# Patient Record
Sex: Female | Born: 1995 | Race: White | Hispanic: No | Marital: Single | Smoking: Never smoker
Health system: Southern US, Community
[De-identification: ages and names within clinical notes are randomized; demographics above are authoritative.]

## PROBLEM LIST (undated history)

## (undated) DIAGNOSIS — F32A Depression, unspecified: Secondary | ICD-10-CM

## (undated) DIAGNOSIS — F329 Major depressive disorder, single episode, unspecified: Secondary | ICD-10-CM

## (undated) DIAGNOSIS — J45909 Unspecified asthma, uncomplicated: Secondary | ICD-10-CM

## (undated) DIAGNOSIS — T7840XA Allergy, unspecified, initial encounter: Secondary | ICD-10-CM

## (undated) HISTORY — DX: Allergy, unspecified, initial encounter: T78.40XA

## (undated) HISTORY — DX: Depression, unspecified: F32.A

## (undated) HISTORY — DX: Unspecified asthma, uncomplicated: J45.909

---
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
## 2015-02-03 ENCOUNTER — Other Ambulatory Visit: Payer: Self-pay | Admitting: Physician Assistant

## 2015-02-09 IMAGING — CR DG CHEST 2V
2 series · 2 of 2 positions shown · non-contrast
Comparison: None.

CLINICAL DATA: Dyspnea, asthma

EXAM:
CHEST  2 VIEW

[w chest pa]
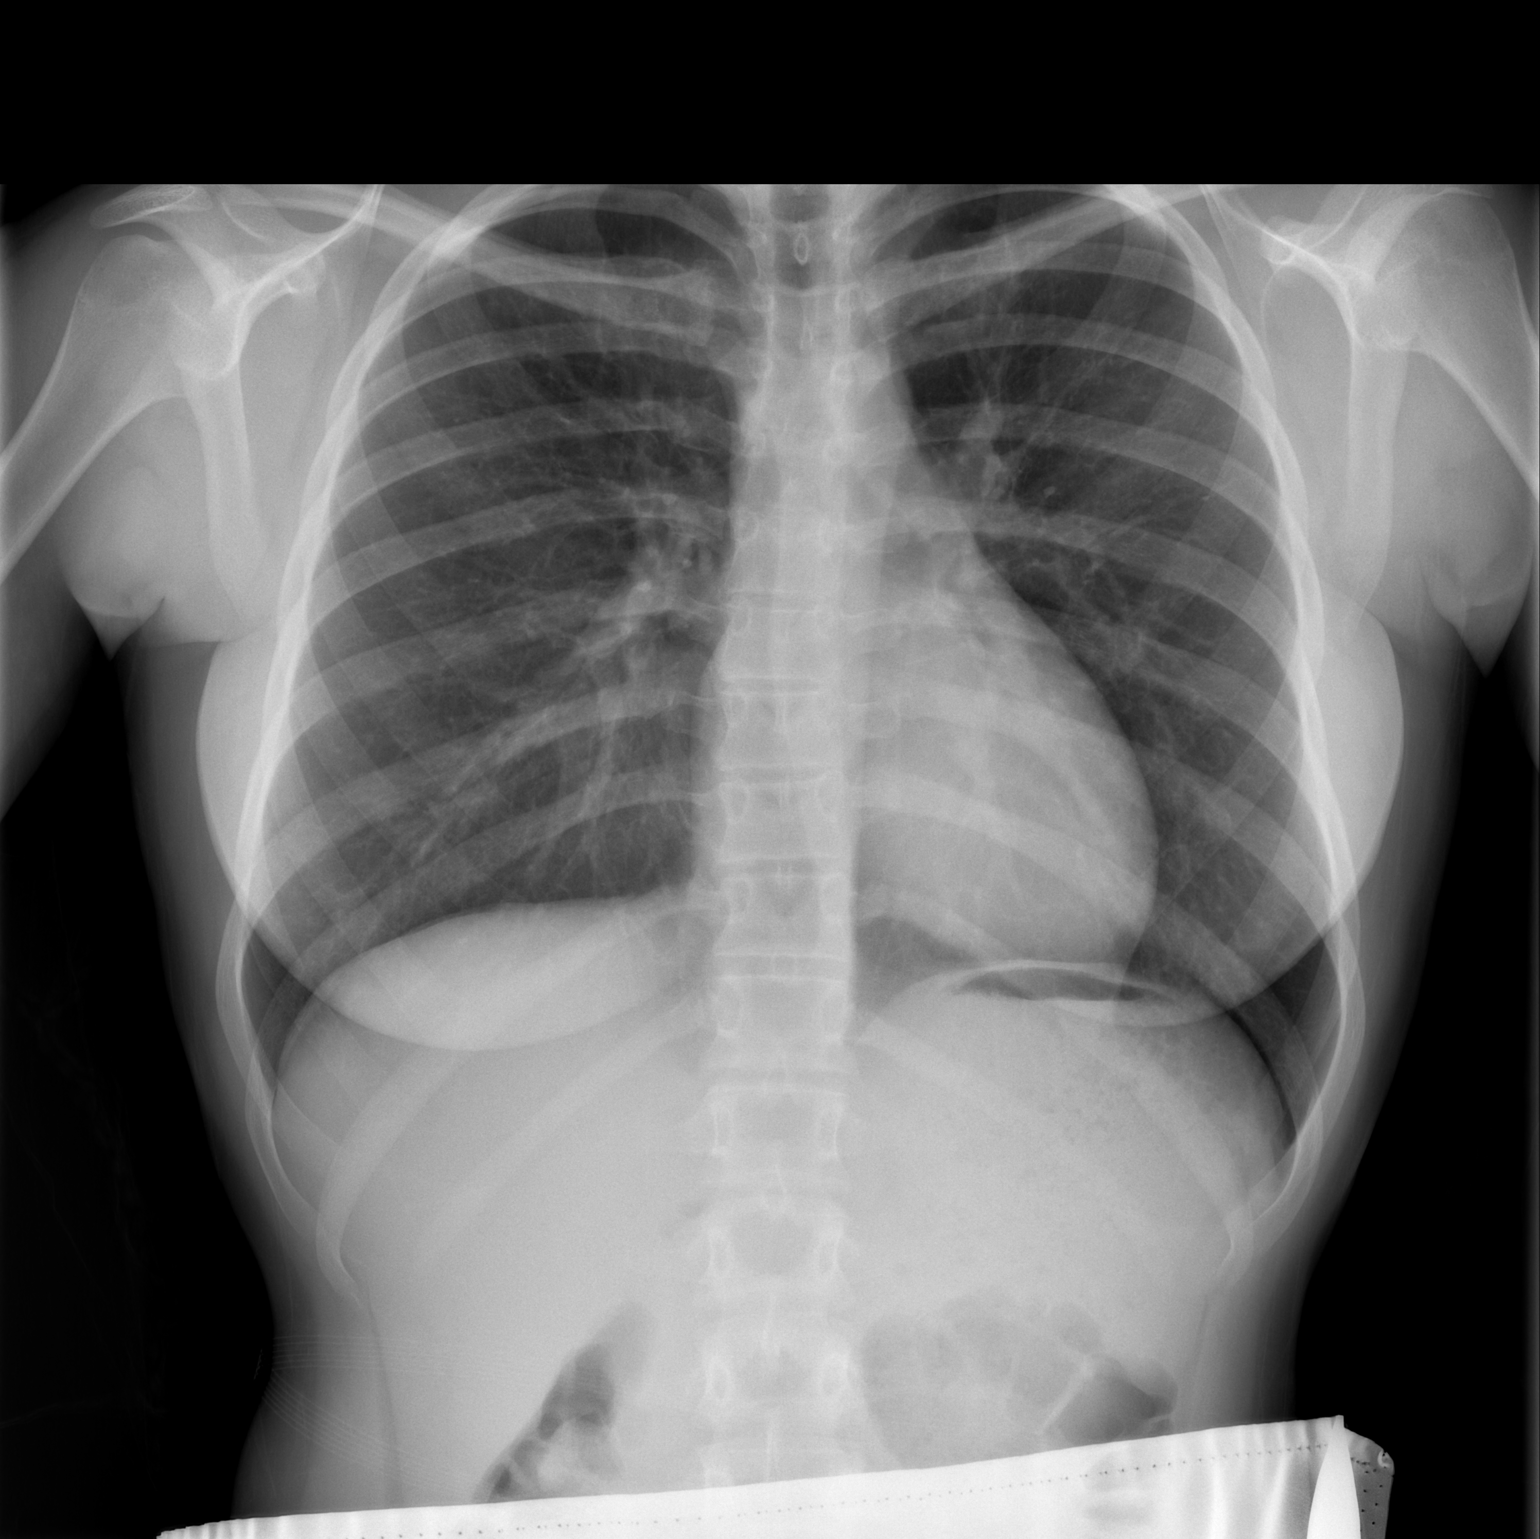

[w chest lat]
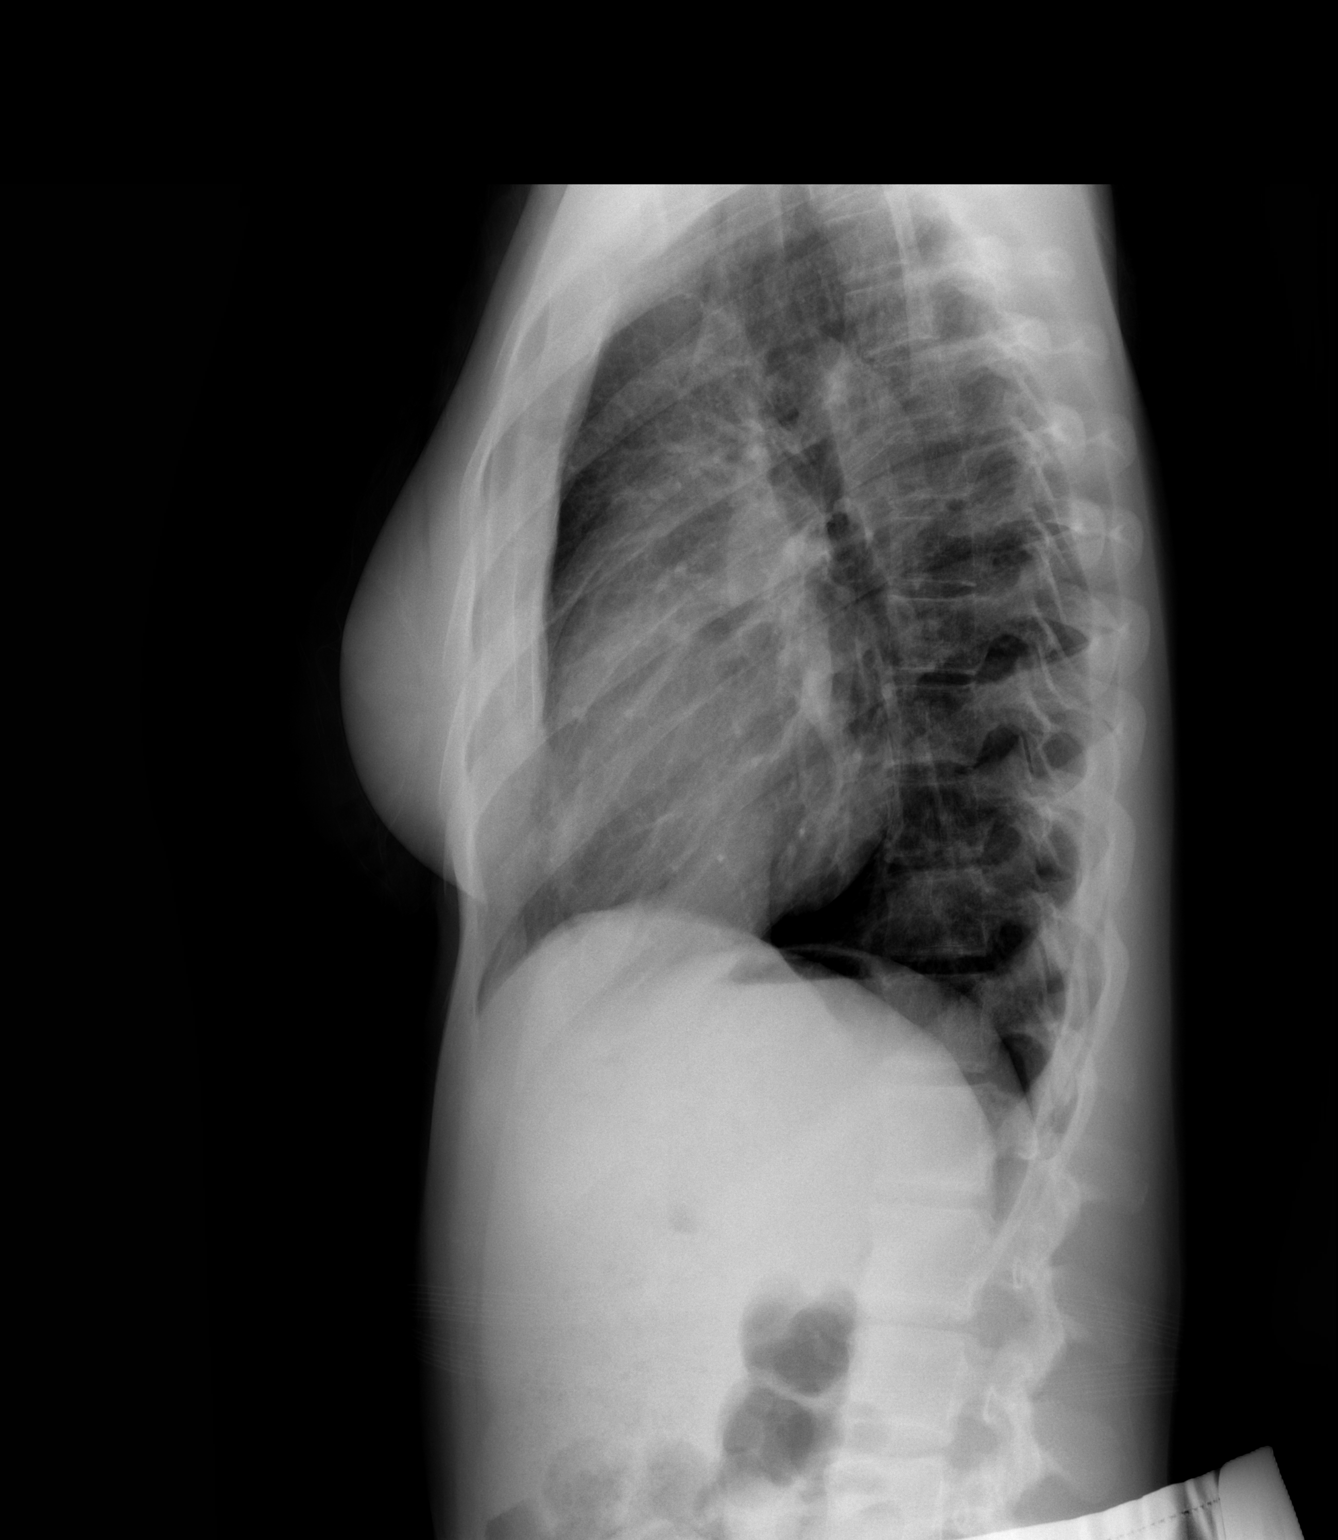

[2 of 2 positions shown; findings below may reference images not displayed]

FINDINGS: Lungs are clear. No pleural effusion or pneumothorax.

The heart is normal size.

Visualized osseous structures are within normal limits.
IMPRESSION: Normal chest radiographs.

## 2016-05-11 ENCOUNTER — Encounter: Payer: Self-pay | Admitting: Physician Assistant

## 2016-05-11 ENCOUNTER — Ambulatory Visit (INDEPENDENT_AMBULATORY_CARE_PROVIDER_SITE_OTHER): Payer: BC Managed Care – PPO | Admitting: Physician Assistant

## 2016-05-11 VITALS — BP 103/58 | HR 58 | Temp 97.6°F | Ht 64.0 in | Wt 145.6 lb

## 2016-05-11 DIAGNOSIS — F902 Attention-deficit hyperactivity disorder, combined type: Secondary | ICD-10-CM | POA: Diagnosis not present

## 2016-05-11 DIAGNOSIS — N946 Dysmenorrhea, unspecified: Secondary | ICD-10-CM

## 2016-05-11 DIAGNOSIS — J301 Allergic rhinitis due to pollen: Secondary | ICD-10-CM

## 2016-05-11 DIAGNOSIS — Z8709 Personal history of other diseases of the respiratory system: Secondary | ICD-10-CM | POA: Insufficient documentation

## 2016-05-11 DIAGNOSIS — Z23 Encounter for immunization: Secondary | ICD-10-CM | POA: Diagnosis not present

## 2016-05-11 MED ORDER — METHYLPHENIDATE HCL ER (OSM) 54 MG PO TBCR: 54.0000 mg | EXTENDED_RELEASE_TABLET | ORAL | 0 refills | Status: DC

## 2016-05-11 MED ORDER — LEVONORGESTREL-ETHINYL ESTRAD 90-20 MCG PO TABS: 1.0000 | ORAL_TABLET | Freq: Every day | ORAL | 99 refills | Status: DC

## 2016-05-11 MED ORDER — CETIRIZINE HCL 10 MG PO TABS: 10.0000 mg | ORAL_TABLET | Freq: Every day | ORAL | 11 refills | Status: DC

## 2016-05-11 NOTE — Progress Notes (Signed)
.   BP (!) 103/58   Pulse (!) 58   Temp 97.6 F (36.4 C) (Oral)   Ht 5\' 4"  (1.626 m)   Wt 145 lb 9.6 oz (66 kg)   BMI 24.99 kg/m    Subjective:    Patient ID: Natalie Hawkins, female    DOB: May 03, 1996, 20 y.o.   MRN: [id number]  Natalie Hawkins is a 20 y.o. female presenting on 05/11/2016 for Follow-up  HPI Patient here to be established as new patient at [facility].  This patient is known to me from [facility]. Patient has been known to me for many years. 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 Her hopes are this next year to get an audition for a cruise line. She is a very intelligent young lady. 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 But she is working on dancing still until she plans to come back and go to college when her dancing years are closing. 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 She is not sexually active and has never been sexually active. Her cycles are well controlled with her birth control pills. She is doing well with her health overall. She would like a flu vaccine today. She has not taken her ADHD medicine in about 6 months and she would like to restart that medication. She took it throughout her middle and high school years. We reviewed her record and her last prescriptions were in mid 2016. She has been on Concerta 54 mg in the past.   Relevant past medical, surgical, family and social history reviewed and updated as indicated. Interim medical history since our last visit reviewed. Allergies and medications reviewed and updated.   Data reviewed from any sources in EPIC.  Review of Systems  Constitutional: Negative.  Negative for activity change, fatigue and fever.  HENT: Negative.   Eyes: Negative.   Respiratory: Negative.  Negative for cough.   Cardiovascular: Negative.   Negative for chest pain.  Gastrointestinal: Negative.  Negative for abdominal pain.  Endocrine: Negative.   Genitourinary: Negative.  Negative for dysuria.  Musculoskeletal: Negative.   Skin: Negative.   Neurological: Negative.     Per HPI unless specifically indicated above  Social History   Social History  . Marital status: Single    Spouse name: N/A  . Number of children: N/A  . Years of education: N/A   Occupational History  . Not on file.   Social History Main Topics  . Smoking status: Never Smoker  . Smokeless tobacco: Never Used  . Alcohol use No  . Drug use: No  . Sexual activity: No   Other Topics Concern  . Not on file   Social History Narrative  . No narrative on file    History reviewed. No pertinent surgical history.  History reviewed. No pertinent family history.    Medication List       Accurate as of 05/11/16  9:03 AM. Always use your most recent med list.          cetirizine 10 MG tablet Commonly known as:  ZYRTEC Take 1 tablet (10 mg total) by mouth daily.   levonorgestrel-ethinyl estradiol 90-20 MCG tablet Commonly known as:  LYBREL,AMETHYST Take 1 tablet by mouth daily.   methylphenidate 54  MG CR tablet Commonly known as:  CONCERTA Take 1 tablet (54 mg total) by mouth every morning.   methylphenidate 54 MG CR tablet Commonly known as:  CONCERTA Take 1 tablet (54 mg total) by mouth every morning.   methylphenidate 54 MG CR tablet Commonly known as:  CONCERTA Take 1 tablet (54 mg total) by mouth every morning.          Objective:    BP (!) 103/58   Pulse (!) 58   Temp 97.6 F (36.4 C) (Oral)   Ht 5\' 4"  (1.626 m)   Wt 145 lb 9.6 oz (66 kg)   BMI 24.99 kg/m   No Known Allergies Wt Readings from Last 3 Encounters:  05/11/16 145 lb 9.6 oz (66 kg) (76 %, Z= 0.69)*   * Growth percentiles are based on CDC 2-20 Years data.    Physical Exam  Constitutional: She is oriented to person, place, and time. She appears  well-developed and well-nourished.  HENT:  Head: Normocephalic and atraumatic.  Eyes: Conjunctivae and EOM are normal. Pupils are equal, round, and reactive to light.  Neck: Normal range of motion. Neck supple.  Cardiovascular: Normal rate, regular rhythm, normal heart sounds and intact distal pulses.   Pulmonary/Chest: Effort normal and breath sounds normal.  Abdominal: Soft. Bowel sounds are normal.  Neurological: She is alert and oriented to person, place, and time. She has normal reflexes.  Skin: Skin is warm and dry. No rash noted.  Psychiatric: She has a normal mood and affect. Her behavior is normal. Judgment and thought content normal.        Assessment & Plan:   1. Acute seasonal allergic rhinitis due to pollen - cetirizine (ZYRTEC) 10 MG tablet; Take 1 tablet (10 mg total) by mouth daily.  Dispense: 30 tablet; Refill: 11  2. Attention deficit hyperactivity disorder (ADHD), combined type - methylphenidate 54 MG PO CR tablet; Take 1 tablet (54 mg total) by mouth every morning.  Dispense: 30 tablet; Refill: 0 - methylphenidate (CONCERTA) 54 MG PO CR tablet; Take 1 tablet (54 mg total) by mouth every morning.  Dispense: 30 tablet; Refill: 0 - methylphenidate (CONCERTA) 54 MG PO CR tablet; Take 1 tablet (54 mg total) by mouth every morning.  Dispense: 30 tablet; Refill: 0  3. Dysmenorrhea - levonorgestrel-ethinyl estradiol (LYBREL,AMETHYST) 90-20 MCG tablet; Take 1 tablet by mouth daily.  Dispense: 1 Package; Refill: prn  4. History of asthma Report any changes and we can restart medications if she needs them.   Continue all other maintenance medications as listed above. Educational handout given for ADD.  Follow up plan: Return in about 3 months (around 08/11/2016).  Remus LofflerAngel S. Orissa Arreaga PA-C [facility] [address]  [place], [place] [id number] [phone]   05/11/2016, 9:03 AM

## 2016-05-11 NOTE — Patient Instructions (Addendum)
Attention Deficit Hyperactivity Disorder  Attention deficit hyperactivity disorder (ADHD) is a problem with behavior issues based on the way the brain functions (neurobehavioral disorder). It is a common reason for behavior and academic problems in school.  SYMPTOMS   There are 3 types of ADHD. The 3 types and some of the symptoms include:  · Inattentive.    Gets bored or distracted easily.    Loses or forgets things. Forgets to hand in homework.    Has trouble organizing or completing tasks.    Difficulty staying on task.    An inability to organize daily tasks and school work.    Leaving projects, chores, or homework unfinished.    Trouble paying attention or responding to details. Careless mistakes.    Difficulty following directions. Often seems like is not listening.    Dislikes activities that require sustained attention (like chores or homework).  · Hyperactive-impulsive.    Feels like it is impossible to sit still or stay in a seat. Fidgeting with hands and feet.    Trouble waiting turn.    Talking too much or out of turn. Interruptive.    Speaks or acts impulsively.    Aggressive, disruptive behavior.    Constantly busy or on the go; noisy.    Often leaves seat when they are expected to remain seated.    Often runs or climbs where it is not appropriate, or feels very restless.  · Combined.    Has symptoms of both of the above.  Often children with ADHD feel discouraged about themselves and with school. They often perform well below their abilities in school.  As children get older, the excess motor activities can calm down, but the problems with paying attention and staying organized persist. Most children do not outgrow ADHD but with good treatment can learn to cope with the symptoms.  DIAGNOSIS   When ADHD is suspected, the diagnosis should be made by professionals trained in ADHD. This professional will collect information about the individual suspected of having ADHD. Information must be collected from  various settings where the person lives, works, or attends school.    Diagnosis will include:  · Confirming symptoms began in childhood.  · Ruling out other reasons for the child's behavior.  · The health care providers will check with the child's school and check their medical records.  · They will talk to teachers and parents.  · Behavior rating scales for the child will be filled out by those dealing with the child on a daily basis.  A diagnosis is made only after all information has been considered.  TREATMENT   Treatment usually includes behavioral treatment, tutoring or extra support in school, and stimulant medicines. Because of the way a person's brain works with ADHD, these medicines decrease impulsivity and hyperactivity and increase attention. This is different than how they would work in a person who does not have ADHD. Other medicines used include antidepressants and certain blood pressure medicines.  Most experts agree that treatment for ADHD should address all aspects of the person's functioning. Along with medicines, treatment should include structured classroom management at school. Parents should reward good behavior, provide constant discipline, and set limits. Tutoring should be available for the child as needed.  ADHD is a lifelong condition. If untreated, the disorder can have long-term serious effects into adolescence and adulthood.  HOME CARE INSTRUCTIONS   · Often with ADHD there is a lot of frustration among family members dealing with the condition. Blame   and anger are also feelings that are common. In many cases, because the problem affects the family as a whole, the entire family may need help. A therapist can help the family find better ways to handle the disruptive behaviors of the person with ADHD and promote change. If the person with ADHD is young, most of the therapist's work is with the parents. Parents will learn techniques for coping with and improving their child's behavior.  Sometimes only the child with the ADHD needs counseling. Your health care providers can help you make these decisions.  · Children with ADHD may need help learning how to organize. Some helpful tips include:  ¨ Keep routines the same every day from wake-up time to bedtime. Schedule all activities, including homework and playtime. Keep the schedule in a place where the person with ADHD will often see it. Mark schedule changes as far in advance as possible.  ¨ Schedule outdoor and indoor recreation.  ¨ Have a place for everything and keep everything in its place. This includes clothing, backpacks, and school supplies.  ¨ Encourage writing down assignments and bringing home needed books. Work with your child's teachers for assistance in organizing school work.  · Offer your child a well-balanced diet. Breakfast that includes a balance of whole grains, protein, and fruits or vegetables is especially important for school performance. Children should avoid drinks with caffeine including:  ¨ Soft drinks.  ¨ Coffee.  ¨ Tea.  ¨ However, some older children (adolescents) may find these drinks helpful in improving their attention. Because it can also be common for adolescents with ADHD to become addicted to caffeine, talk with your health care provider about what is a safe amount of caffeine intake for your child.  · Children with ADHD need consistent rules that they can understand and follow. If rules are followed, give small rewards. Children with ADHD often receive, and expect, criticism. Look for good behavior and praise it. Set realistic goals. Give clear instructions. Look for activities that can foster success and self-esteem. Make time for pleasant activities with your child. Give lots of affection.  · Parents are their children's greatest advocates. Learn as much as possible about ADHD. This helps you become a stronger and better advocate for your child. It also helps you educate your child's teachers and instructors  if they feel inadequate in these areas. Parent support groups are often helpful. A national group with local chapters is called Children and Adults with Attention Deficit Hyperactivity Disorder (CHADD).  SEEK MEDICAL CARE IF:  · Your child has repeated muscle twitches, cough, or speech outbursts.  · Your child has sleep problems.  · Your child has a marked loss of appetite.  · Your child develops depression.  · Your child has new or worsening behavioral problems.  · Your child develops dizziness.  · Your child has a racing heart.  · Your child has stomach pains.  · Your child develops headaches.  SEEK IMMEDIATE MEDICAL CARE IF:  · Your child has been diagnosed with depression or anxiety and the symptoms seem to be getting worse.  · Your child has been depressed and suddenly appears to have increased energy or motivation.  · You are worried that your child is having a bad reaction to a medication he or she is taking for ADHD.     This information is not intended to replace advice given to you by your health care provider. Make sure you discuss any questions you have with your   health care provider.     Document Released: 07/15/2002 Document Revised: 07/30/2013 Document Reviewed: 04/01/2013  Elsevier Interactive Patient Education ©2016 Elsevier Inc.

## 2016-07-28 ENCOUNTER — Ambulatory Visit (INDEPENDENT_AMBULATORY_CARE_PROVIDER_SITE_OTHER): Payer: BC Managed Care – PPO | Admitting: Family Medicine

## 2016-07-28 ENCOUNTER — Encounter: Payer: Self-pay | Admitting: Family Medicine

## 2016-07-28 VITALS — BP 121/76 | HR 95 | Temp 97.6°F | Ht 64.0 in | Wt 133.2 lb

## 2016-07-28 DIAGNOSIS — A084 Viral intestinal infection, unspecified: Secondary | ICD-10-CM

## 2016-07-28 MED ORDER — ONDANSETRON 4 MG PO TBDP: 4.0000 mg | ORAL_TABLET | Freq: Three times a day (TID) | ORAL | 0 refills | Status: DC | PRN

## 2016-07-28 NOTE — Patient Instructions (Signed)
Great to meet you!   Viral Gastroenteritis, Adult Introduction Viral gastroenteritis is also known as the stomach flu. This condition is caused by certain germs (viruses). These germs can be passed from person to person very easily (are very contagious). This condition can cause sudden watery poop (diarrhea), fever, and throwing up (vomiting). Having watery poop and throwing up can make you feel weak and cause you to get dehydrated. Dehydration can make you tired and thirsty, make you have a dry mouth, and make it so you pee (urinate) less often. Older adults and people with other diseases or a weak defense system (immune system) are at higher risk for dehydration. It is important to replace the fluids that you lose from having watery poop and throwing up. Follow these instructions at home: Follow instructions from your doctor about how to care for yourself at home. Eating and drinking Follow these instructions as told by your doctor:  Take an oral rehydration solution (ORS). This is a drink that is sold at pharmacies and stores.  Drink clear fluids in small amounts as you are able, such as:  Water.  Ice chips.  Diluted fruit juice.  Low-calorie sports drinks.  Eat bland, easy-to-digest foods in small amounts as you are able, such as:  Bananas.  Applesauce.  Rice.  Low-fat (lean) meats.  Toast.  Crackers.  Avoid fluids that have a lot of sugar or caffeine in them.  Avoid alcohol.  Avoid spicy or fatty foods. General instructions  Drink enough fluid to keep your pee (urine) clear or pale yellow.  Wash your hands often. If you cannot use soap and water, use hand sanitizer.  Make sure that all people in your home wash their hands well and often.  Rest at home while you get better.  Take over-the-counter and prescription medicines only as told by your doctor.  Watch your condition for any changes.  Take a warm bath to help with any burning or pain from having  watery poop.  Keep all follow-up visits as told by your doctor. This is important. Contact a doctor if:  You cannot keep fluids down.  Your symptoms get worse.  You have new symptoms.  You feel light-headed or dizzy.  You have muscle cramps. Get help right away if:  You have chest pain.  You feel very weak or you pass out (faint).  You see blood in your throw-up.  Your throw-up looks like coffee grounds.  You have bloody or black poop (stools) or poop that look like tar.  You have a very bad headache, a stiff neck, or both.  You have a rash.  You have very bad pain, cramping, or bloating in your belly (abdomen).  You have trouble breathing.  You are breathing very quickly.  Your heart is beating very quickly.  Your skin feels cold and clammy.  You feel confused.  You have pain when you pee.  You have signs of dehydration, such as:  Dark pee, hardly any pee, or no pee.  Cracked lips.  Dry mouth.  Sunken eyes.  Sleepiness.  Weakness. This information is not intended to replace advice given to you by your health care provider. Make sure you discuss any questions you have with your health care provider. Document Released: 01/11/2008 Document Revised: 02/12/2016 Document Reviewed: 03/31/2015  2017 Elsevier

## 2016-07-28 NOTE — Progress Notes (Signed)
   HPI  Patient presents today here with nausea and abdominal pain.  Patient explains that she's had symptoms for about 2 days. They started after she drank a protein shake and had a vigorous exercise workout. She has had some similar illness with a protein shake previously that lasted 15 minutes and r resolves.  She states however this time it did not improve. She's also had fatigue, generalized abdominal pain, and nausea during this time. She is generally a little bit constipated that has not changed. She is tolerating food and fluids normally but has a decreased appetite.  No fevers, chills, sweats, cough, chest pain, severe abdominal pain, or fluid intolerance.  PMH: Smoking status noted ROS: Per HPI  Objective: BP 121/76   Pulse 95   Temp 97.6 F (36.4 C) (Oral)   Ht 5\' 4"  (1.626 m)   Wt 133 lb 3.2 oz (60.4 kg)   BMI 22.86 kg/m  Gen: NAD, alert, cooperative with exam HEENT: NCAT, TMs normal bilaterally, oropharynx moist and clear with slightly enlarged tonsils bilaterally, nares clear CV: RRR, good S1/S2, no murmur Resp: CTABL, no wheezes, non-labored Abd: Soft, mild to palpation in the right lower quadrant and less severe throughout, positive bowel sounds Ext: No edema, warm Neuro: Alert and oriented, No gross deficits  Assessment and plan:  # Viral gastroenteritis Reassurance provided Low threshold for return with mild right lower quadrant tenderness, however I think acute appendicitis is very unlikely Small amount of Zofran given Discussed supportive care, return as needed   Meds ordered this encounter  Medications  . ondansetron (ZOFRAN-ODT) 4 MG disintegrating tablet    Sig: Take 1 tablet (4 mg total) by mouth every 8 (eight) hours as needed for nausea or vomiting.    Dispense:  20 tablet    Refill:  0    Murtis SinkSam Asuncion Shibata, MD [facility] 07/28/2016, 9:08 AM

## 2016-08-03 ENCOUNTER — Telehealth: Payer: Self-pay | Admitting: *Deleted

## 2016-08-03 NOTE — Telephone Encounter (Signed)
Patient's mother French Ana(Tracy) called stating that patient will be running out of her medication soon.  Patient is moving to [place] on 08/10/2016.  Patient is needing a refill on Concerta before she moves.

## 2016-08-03 NOTE — Telephone Encounter (Signed)
Can she make the 2:40 spot tomorrow?

## 2016-08-03 NOTE — Telephone Encounter (Signed)
Appt made

## 2016-08-04 ENCOUNTER — Ambulatory Visit (INDEPENDENT_AMBULATORY_CARE_PROVIDER_SITE_OTHER): Payer: BC Managed Care – PPO | Admitting: Physician Assistant

## 2016-08-04 ENCOUNTER — Encounter: Payer: Self-pay | Admitting: Physician Assistant

## 2016-08-04 VITALS — BP 131/87 | HR 83 | Temp 97.0°F | Ht 64.0 in | Wt 134.0 lb

## 2016-08-04 DIAGNOSIS — L700 Acne vulgaris: Secondary | ICD-10-CM

## 2016-08-04 DIAGNOSIS — F902 Attention-deficit hyperactivity disorder, combined type: Secondary | ICD-10-CM

## 2016-08-04 MED ORDER — METHYLPHENIDATE HCL ER (OSM) 54 MG PO TBCR: 54.0000 mg | EXTENDED_RELEASE_TABLET | ORAL | 0 refills | Status: DC

## 2016-08-04 NOTE — Patient Instructions (Signed)

## 2016-08-09 NOTE — Progress Notes (Signed)
BP 131/87   Pulse 83   Temp 97 F (36.1 C) (Oral)   Ht 5\' 4"  (1.626 m)   Wt 134 lb (60.8 kg)   LMP 07/28/2016 (Approximate)   BMI 23.00 kg/m    Subjective:    Patient ID: Natalie Hawkins, female    DOB: 27-Sep-1995, 21 y.o.   MRN: [id number]  HPI: Natalie Hawkins is a 21 y.o. female presenting on 08/04/2016 for ADHD (pt here today for refills on her Concerta and she will be out of state for the next 3 months in [place])  This patient comes in for periodic recheck on medications and conditions. All medications are reviewed today. There are no reports of any problems with the medications. All of the medical conditions are reviewed and updated.  Lab work is reviewed and will be ordered as medically necessary. There are no new problems reported with today's visit. 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 She may be unavailable 3 months to come back for a recheck. She has been a very good in complaint patient over the years. If everything is well controlled we will discuss refilling her medications with the help of her mother.  Past Medical History:  Diagnosis Date  . Allergy   . Asthma    Well-controlled at this time   Relevant past medical, surgical, family and social history reviewed and updated as indicated. Interim medical history since our last visit reviewed. Allergies and medications reviewed and updated. DATA REVIEWED: CHART IN EPIC  Social History   Social History  . Marital status: Single    Spouse name: N/A  . Number of children: N/A  . Years of education: N/A   Occupational History  . Not on file.   Social History Main Topics  . Smoking status: Never Smoker  . Smokeless tobacco: Never Used  . Alcohol use No  . Drug use: No  . Sexual activity: No   Other Topics Concern  . Not on file   Social History Narrative  . No narrative on file    No past surgical history on file.  Family History  Problem Relation Age of  Onset  . Hypothyroidism Father   . ADD / ADHD Sister   . ADD / ADHD Brother   . Arthritis Maternal Grandmother   . Depression Maternal Grandmother   . Heart disease Maternal Grandmother     Review of Systems  Constitutional: Negative.  Negative for activity change, fatigue and fever.  HENT: Negative.   Eyes: Negative.   Respiratory: Negative.  Negative for cough.   Cardiovascular: Negative.  Negative for chest pain.  Gastrointestinal: Negative.  Negative for abdominal pain.  Endocrine: Negative.   Genitourinary: Negative.  Negative for dysuria.  Musculoskeletal: Negative.   Skin: Negative.   Neurological: Negative.     Allergies as of 08/04/2016   No Known Allergies     Medication List       Accurate as of 08/04/16 11:59 PM. Always use your most recent med list.          cetirizine 10 MG tablet Commonly known as:  ZYRTEC Take 1 tablet (10 mg total) by mouth daily.   levonorgestrel-ethinyl estradiol 90-20 MCG tablet Commonly known as:  LYBREL,AMETHYST Take 1 tablet by mouth daily.   methylphenidate 54 MG CR tablet Commonly known as:  CONCERTA Take 1 tablet (54 mg total) by mouth every morning.   methylphenidate 54 MG CR tablet  Commonly known as:  CONCERTA Take 1 tablet (54 mg total) by mouth every morning.   methylphenidate 54 MG CR tablet Commonly known as:  CONCERTA Take 1 tablet (54 mg total) by mouth every morning.   metroNIDAZOLE 500 MG tablet Commonly known as:  FLAGYL Take 250 mg by mouth 1 day or 1 dose.          Objective:    BP 131/87   Pulse 83   Temp 97 F (36.1 C) (Oral)   Ht 5\' 4"  (1.626 m)   Wt 134 lb (60.8 kg)   LMP 07/28/2016 (Approximate)   BMI 23.00 kg/m   No Known Allergies  Wt Readings from Last 3 Encounters:  08/04/16 134 lb (60.8 kg)  07/28/16 133 lb 3.2 oz (60.4 kg)  05/11/16 145 lb 9.6 oz (66 kg) (76 %, Z= 0.69)*   * Growth percentiles are based on CDC 2-20 Years data.    Physical Exam  Constitutional: She is  oriented to person, place, and time. She appears well-developed and well-nourished.  HENT:  Head: Normocephalic and atraumatic.  Right Ear: Tympanic membrane, external ear and ear canal normal.  Left Ear: Tympanic membrane, external ear and ear canal normal.  Nose: Nose normal. No rhinorrhea.  Mouth/Throat: Oropharynx is clear and moist and mucous membranes are normal. No oropharyngeal exudate or posterior oropharyngeal erythema.  Eyes: Conjunctivae and EOM are normal. Pupils are equal, round, and reactive to light.  Neck: Normal range of motion. Neck supple.  Cardiovascular: Normal rate, regular rhythm, normal heart sounds and intact distal pulses.   Pulmonary/Chest: Effort normal and breath sounds normal.  Abdominal: Soft. Bowel sounds are normal.  Neurological: She is alert and oriented to person, place, and time. She has normal reflexes.  Skin: Skin is warm and dry. No rash noted.  Psychiatric: She has a normal mood and affect. Her behavior is normal. Judgment and thought content normal.  Nursing note and vitals reviewed.   No results found for this or any previous visit.    Assessment & Plan:   1. Attention deficit hyperactivity disorder (ADHD), combined type - methylphenidate 54 MG PO CR tablet; Take 1 tablet (54 mg total) by mouth every morning.  Dispense: 30 tablet; Refill: 0 - methylphenidate (CONCERTA) 54 MG PO CR tablet; Take 1 tablet (54 mg total) by mouth every morning.  Dispense: 30 tablet; Refill: 0 - methylphenidate (CONCERTA) 54 MG PO CR tablet; Take 1 tablet (54 mg total) by mouth every morning.  Dispense: 30 tablet; Refill: 0  2. Acne vulgaris - metroNIDAZOLE (FLAGYL) 500 MG tablet; Take 250 mg by mouth 1 day or 1 dose.   Continue all other maintenance medications as listed above.  Follow up plan: Return in about 3 months (around 11/02/2016) for recheck.  No orders of the defined types were placed in this encounter.   Educational handout given for  ADHD  Remus LofflerAngel S. Talasia Saulter PA-C [facility] [address]  [place], [place] [address] [phone]   08/09/2016, 2:17 PM\

## 2016-09-13 ENCOUNTER — Encounter: Payer: Self-pay | Admitting: Physician Assistant

## 2016-09-13 ENCOUNTER — Ambulatory Visit (INDEPENDENT_AMBULATORY_CARE_PROVIDER_SITE_OTHER): Payer: BC Managed Care – PPO | Admitting: Physician Assistant

## 2016-09-13 VITALS — BP 131/89 | HR 97 | Temp 100.6°F | Ht 64.0 in | Wt 136.8 lb

## 2016-09-13 DIAGNOSIS — R52 Pain, unspecified: Secondary | ICD-10-CM

## 2016-09-13 DIAGNOSIS — J101 Influenza due to other identified influenza virus with other respiratory manifestations: Secondary | ICD-10-CM

## 2016-09-13 LAB — VERITOR FLU A/B WAIVED
Influenza A: NEGATIVE
Influenza B: POSITIVE — AB

## 2016-09-13 MED ORDER — OSELTAMIVIR PHOSPHATE 75 MG PO CAPS: 75.0000 mg | ORAL_CAPSULE | Freq: Two times a day (BID) | ORAL | 0 refills | Status: DC

## 2016-09-13 NOTE — Progress Notes (Signed)
BP 131/89   Pulse 97   Temp (!) 100.6 F (38.1 C) (Oral)   Ht 5\' 4"  (1.626 m)   Wt 136 lb 12.8 oz (62.1 kg)   BMI 23.48 kg/m    Subjective:    Patient ID: Natalie Hawkins, female    DOB: [birth year], 20 y.o.   MRN: [id number]  HPI: Natalie Hawkins is a 21 y.o. female presenting on 09/13/2016 for Chills; Generalized Body Aches; and Headache  This patient has had less than 2 days severe fever, chills, myalgias.  Complains of sinus headache and postnasal drainage. There is copious drainage at times. Associated sore throat, decreased appetite and headache.  Has been exposed to influenza.    Relevant past medical, surgical, family and social history reviewed and updated as indicated. Allergies and medications reviewed and updated.  Past Medical History:  Diagnosis Date  . Allergy   . Asthma    Well-controlled at this time    History reviewed. No pertinent surgical history.  Review of Systems  Constitutional: Positive for appetite change, chills, fatigue and fever. Negative for activity change.  HENT: Positive for congestion, postnasal drip and sore throat.   Eyes: Negative.   Respiratory: Negative for cough and wheezing.   Cardiovascular: Negative.  Negative for chest pain, palpitations and leg swelling.  Gastrointestinal: Negative.   Genitourinary: Negative.   Musculoskeletal: Positive for myalgias.  Skin: Negative.   Neurological: Positive for headaches.    Allergies as of 09/13/2016   No Known Allergies     Medication List       Accurate as of 09/13/16 12:59 PM. Always use your most recent med list.          cetirizine 10 MG tablet Commonly known as:  ZYRTEC Take 1 tablet (10 mg total) by mouth daily.   levonorgestrel-ethinyl estradiol 90-20 MCG tablet Commonly known as:  LYBREL,AMETHYST Take 1 tablet by mouth daily.   methylphenidate 54 MG CR tablet Commonly known as:  CONCERTA Take 1 tablet (54 mg total) by mouth every morning.   methylphenidate 54 MG CR  tablet Commonly known as:  CONCERTA Take 1 tablet (54 mg total) by mouth every morning.   methylphenidate 54 MG CR tablet Commonly known as:  CONCERTA Take 1 tablet (54 mg total) by mouth every morning.   oseltamivir 75 MG capsule Commonly known as:  TAMIFLU Take 1 capsule (75 mg total) by mouth 2 (two) times daily.          Objective:    BP 131/89   Pulse 97   Temp (!) 100.6 F (38.1 C) (Oral)   Ht 5\' 4"  (1.626 m)   Wt 136 lb 12.8 oz (62.1 kg)   BMI 23.48 kg/m   No Known Allergies  Physical Exam  Constitutional: She is oriented to person, place, and time. She appears well-developed and well-nourished. No distress.  HENT:  Head: Normocephalic and atraumatic.  Right Ear: Tympanic membrane normal.  Left Ear: Tympanic membrane normal.  Nose: Mucosal edema and rhinorrhea present. Right sinus exhibits no frontal sinus tenderness. Left sinus exhibits no frontal sinus tenderness.  Mouth/Throat: Posterior oropharyngeal erythema present. No oropharyngeal exudate or tonsillar abscesses.  Eyes: Conjunctivae and EOM are normal. Pupils are equal, round, and reactive to light.  Neck: Normal range of motion.  Cardiovascular: Normal rate, regular rhythm, normal heart sounds, intact distal pulses and normal pulses.   Pulmonary/Chest: Effort normal and breath sounds normal. No respiratory distress.  Abdominal: Soft. Bowel sounds are  normal.  Neurological: She is alert and oriented to person, place, and time. She has normal reflexes.  Skin: Skin is warm and dry. No rash noted.  Psychiatric: She has a normal mood and affect. Her behavior is normal. Judgment and thought content normal.  Nursing note and vitals reviewed.   No results found for this or any previous visit.    Assessment & Plan:   1. Body aches - Veritor Flu A/B Waived - oseltamivir (TAMIFLU) 75 MG capsule; Take 1 capsule (75 mg total) by mouth 2 (two) times daily.  Dispense: 10 capsule; Refill: 0  2. Influenza B -  oseltamivir (TAMIFLU) 75 MG capsule; Take 1 capsule (75 mg total) by mouth 2 (two) times daily.  Dispense: 10 capsule; Refill: 0   Continue all other maintenance medications as listed above.  Follow up plan: Return if symptoms worsen or fail to improve.  Orders Placed This Encounter  Procedures  . Veritor Flu A/B [employer] Health SolutionsWaived    Educational handout given for influenza  Remus LofflerAngel S. Mishti Swanton PA-C [facility] [address]  [place], [place] [address] [phone]   09/13/2016, 12:59 PM

## 2016-09-13 NOTE — Patient Instructions (Signed)

## 2016-11-02 ENCOUNTER — Ambulatory Visit: Payer: BC Managed Care – PPO | Admitting: Physician Assistant

## 2016-11-29 ENCOUNTER — Other Ambulatory Visit: Payer: Self-pay | Admitting: Physician Assistant

## 2016-11-29 DIAGNOSIS — F902 Attention-deficit hyperactivity disorder, combined type: Secondary | ICD-10-CM

## 2016-12-01 ENCOUNTER — Telehealth: Payer: Self-pay | Admitting: Physician Assistant

## 2016-12-01 DIAGNOSIS — F902 Attention-deficit hyperactivity disorder, combined type: Secondary | ICD-10-CM

## 2016-12-02 MED ORDER — METHYLPHENIDATE HCL ER (OSM) 54 MG PO TBCR: 54.0000 mg | EXTENDED_RELEASE_TABLET | ORAL | 0 refills | Status: DC

## 2016-12-02 NOTE — Telephone Encounter (Signed)
We can make arrangements to fill her meds and see her at least twice yearly when she is in town.  Was a refill needed now?  I am uncertain of the rules for midlevel controlled scripts being filled in [place].  They cannot in .  They may want to call a pharmacy there first.

## 2016-12-02 NOTE — Telephone Encounter (Signed)
Left detailed message per dpr  

## 2016-12-02 NOTE — Telephone Encounter (Signed)
Dad aware and states that patient will not be able to come down two times a year due to her working during the week and it also being a 4 hour drive. Dad states that Tangela will have to find a doctors office closer. Wanting to know if you can fill the Concerta for at least three months until she is able to find a new PCP. Please advise and send back to the pools.

## 2017-07-03 ENCOUNTER — Telehealth: Payer: Self-pay | Admitting: Physician Assistant

## 2017-07-03 ENCOUNTER — Other Ambulatory Visit: Payer: Self-pay | Admitting: Physician Assistant

## 2017-07-03 ENCOUNTER — Ambulatory Visit: Payer: BC Managed Care – PPO | Admitting: Pediatrics

## 2017-07-03 MED ORDER — ALBUTEROL SULFATE HFA 108 (90 BASE) MCG/ACT IN AERS: 2.0000 | INHALATION_SPRAY | Freq: Four times a day (QID) | RESPIRATORY_TRACT | 5 refills | Status: DC | PRN

## 2017-07-03 NOTE — Telephone Encounter (Signed)
What is the name of the medication? Inhaler, dad not sure of name of it  Have you contacted your pharmacy to request a refill? No he does not know name of inhaler, pt is in [place] and he will mail to her  Which pharmacy would you like this sent to? [facility] store [place]   Patient notified that their request is being sent to the clinical staff for review and that they should receive a call once it is complete. If they do not receive a call within 24 hours they can check with their pharmacy or our office.

## 2017-08-20 ENCOUNTER — Emergency Department (HOSPITAL_BASED_OUTPATIENT_CLINIC_OR_DEPARTMENT_OTHER)
Admission: EM | Admit: 2017-08-20 | Discharge: 2017-08-20 | Disposition: A | Discharge status: Home / Self Care | Payer: BC Managed Care – PPO | Attending: Emergency Medicine | Admitting: Emergency Medicine

## 2017-08-20 ENCOUNTER — Other Ambulatory Visit: Payer: Self-pay

## 2017-08-20 ENCOUNTER — Emergency Department (HOSPITAL_BASED_OUTPATIENT_CLINIC_OR_DEPARTMENT_OTHER): Payer: BC Managed Care – PPO

## 2017-08-20 ENCOUNTER — Encounter (HOSPITAL_BASED_OUTPATIENT_CLINIC_OR_DEPARTMENT_OTHER): Payer: Self-pay | Admitting: *Deleted

## 2017-08-20 DIAGNOSIS — Y999 Unspecified external cause status: Secondary | ICD-10-CM | POA: Diagnosis not present

## 2017-08-20 DIAGNOSIS — J45909 Unspecified asthma, uncomplicated: Secondary | ICD-10-CM | POA: Diagnosis not present

## 2017-08-20 DIAGNOSIS — S9002XA Contusion of left ankle, initial encounter: Secondary | ICD-10-CM

## 2017-08-20 DIAGNOSIS — Z79899 Other long term (current) drug therapy: Secondary | ICD-10-CM | POA: Diagnosis not present

## 2017-08-20 DIAGNOSIS — Y9289 Other specified places as the place of occurrence of the external cause: Secondary | ICD-10-CM | POA: Diagnosis not present

## 2017-08-20 DIAGNOSIS — S99812A Other specified injuries of left ankle, initial encounter: Secondary | ICD-10-CM | POA: Diagnosis present

## 2017-08-20 DIAGNOSIS — Y9389 Activity, other specified: Secondary | ICD-10-CM | POA: Diagnosis not present

## 2017-08-20 DIAGNOSIS — W228XXA Striking against or struck by other objects, initial encounter: Secondary | ICD-10-CM | POA: Diagnosis not present

## 2017-08-20 NOTE — ED Notes (Signed)
Patient transported to X-ray 

## 2017-08-20 NOTE — ED Triage Notes (Signed)
Pt reports that she was at dance and kicked a piece of equipment with her left foot. Swelling noted to her left medical ankle.

## 2017-08-20 NOTE — Discharge Instructions (Signed)
Elevated and ice your ankle when at rest. Weight bear as tolerated, return to activity as tolerated.

## 2017-08-21 NOTE — ED Provider Notes (Signed)
[facility] EMERGENCY DEPARTMENT Provider Note   CSN: [id number] Arrival date & time: 08/20/17  1834     History   Chief Complaint Chief Complaint  Patient presents with  . Foot Injury    HPI Natalie Hawkins is a 22 y.o. female.  HPI  22 year old female presents with concern for left ankle pain.  Reports that she was at the gym, and was finishing and stretching, when she hit her left ankle against a piece of weight lifting equipment.  Reports she had immediate pain to her left ankle.  Reports the pain is severe.  Has not yet had anything for pain.  Denies any other injuries.  Denies numbness.  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  Pain radiates from ankle up towards leg.    Past Medical History:  Diagnosis Date  . Allergy   . Asthma    Well-controlled at this time    Patient Active Problem List   Diagnosis Date Noted  . Acute seasonal allergic rhinitis due to pollen 05/11/2016  . Attention deficit hyperactivity disorder (ADHD), combined type 05/11/2016  . Dysmenorrhea 05/11/2016  . History of asthma 05/11/2016    History reviewed. No pertinent surgical history.  OB History    No data available       Home Medications    Prior to Admission medications   Medication Sig Start Date End Date Taking? Authorizing Provider  albuterol (PROVENTIL HFA;VENTOLIN HFA) 108 (90 Base) MCG/ACT inhaler Inhale 2 puffs into the lungs every 6 (six) hours as needed for wheezing or shortness of breath. 07/03/17   Remus Loffler, PA-C  cetirizine (ZYRTEC) 10 MG tablet Take 1 tablet (10 mg total) by mouth daily. 05/11/16   Remus Loffler, PA-C  levonorgestrel-ethinyl estradiol (LYBREL,AMETHYST) 90-20 MCG tablet Take 1 tablet by mouth daily. 05/11/16   Remus Loffler, PA-C    Family History Family History  Problem Relation Age of Onset  . Hypothyroidism Father   . ADD / ADHD Sister   . ADD / ADHD Brother   . Arthritis Maternal Grandmother   . Depression  Maternal Grandmother   . Heart disease Maternal Grandmother     Social History Social History   Tobacco Use  . Smoking status: Never Smoker  . Smokeless tobacco: Never Used  Substance Use Topics  . Alcohol use: No  . Drug use: No     Allergies   Patient has no known allergies.   Review of Systems Review of Systems  Constitutional: Negative for fever.  Respiratory: Negative for shortness of breath.   Gastrointestinal: Negative for vomiting.  Musculoskeletal: Positive for arthralgias.  Skin: Negative for wound.  Neurological: Negative for numbness.     Physical Exam Updated Vital Signs BP 131/87 (BP Location: Left Arm)   Pulse 79   Temp 98.9 F (37.2 C) (Oral)   Resp 18   Ht 5\' 5"  (1.651 m)   Wt 59 kg (130 lb)   LMP 08/19/2017   SpO2 100%   BMI 21.63 kg/m   Physical Exam  Constitutional: She is oriented to person, place, and time. She appears well-developed and well-nourished. No distress.  HENT:  Head: Normocephalic and atraumatic.  Eyes: Conjunctivae and EOM are normal.  Neck: Normal range of motion.  Cardiovascular: Normal rate, regular rhythm and intact distal pulses.  Pulmonary/Chest: Effort normal. No respiratory distress.  Musculoskeletal: She exhibits no edema.       Left ankle: She exhibits swelling  and ecchymosis. She exhibits normal pulse. Tenderness. Medial malleolus tenderness found. No AITFL, no posterior TFL, no head of 5th metatarsal and no proximal fibula tenderness found.  Neurological: She is alert and oriented to person, place, and time.  Skin: Skin is warm and dry. No rash noted. She is not diaphoretic. No erythema.  Nursing note and vitals reviewed.    ED Treatments / Results  Labs (all labs ordered are listed, but only abnormal results are displayed) Labs Reviewed - No data to display  EKG  EKG Interpretation None       Radiology Dg Ankle Complete Left  Result Date: 08/20/2017 CLINICAL DATA:  Medial and an anterior left  ankle pain and swelling after kicking a piece of equipment with her left foot. EXAM: LEFT ANKLE COMPLETE - 3+ VIEW COMPARISON:  None. FINDINGS: Mild diffuse soft tissue swelling. No fracture, dislocation or effusion. IMPRESSION: No fracture. Electronically Signed   By: Beckie SaltsSteven  Reid M.D.   On: 08/20/2017 18:56    Procedures Procedures (including critical care time)  Medications Ordered in ED Medications - No data to display   Initial Impression / Assessment and Plan / ED Course  I have reviewed the triage vital signs and the nursing notes.  Pertinent labs & imaging results that were available during my care of the patient were reviewed by me and considered in my medical decision making (see chart for details).     22 year old female presents with concern for left ankle pain.  X-ray done shows no sign of fracture.  Given mechanism, overall of low suspicion this represents sprain.  Region with contusion to her ankle, feel pain is likely secondary to contusion from direct trauma.  Recommend ice, elevation, ibuprofen and Tylenol.  Recommend activity as tolerated. Patient discharged in stable condition with understanding of reasons to return.   Final Clinical Impressions(s) / ED Diagnoses   Final diagnoses:  Contusion of left ankle, initial encounter    ED Discharge Orders    None       Alvira MondaySchlossman, Tara Wich, MD 08/21/17 0222

## 2017-08-28 ENCOUNTER — Other Ambulatory Visit: Payer: Self-pay | Admitting: Physician Assistant

## 2017-08-28 DIAGNOSIS — N946 Dysmenorrhea, unspecified: Secondary | ICD-10-CM

## 2017-10-27 ENCOUNTER — Telehealth: Payer: Self-pay | Admitting: Physician Assistant

## 2017-10-27 ENCOUNTER — Other Ambulatory Visit: Payer: Self-pay | Admitting: Physician Assistant

## 2017-10-27 NOTE — Telephone Encounter (Signed)
Please review and advise.

## 2017-10-27 NOTE — Telephone Encounter (Signed)
Going [place] of [place] mths to [place], but she is going to run out of meds. She leaves Monday. What does she need to do to get her refills on ADHA and BC meds. Please advise.

## 2017-10-29 NOTE — Telephone Encounter (Signed)
I can send the scripts to local pharmacy and when she is in town to come in. Her family can work on how to get it to her.

## 2017-10-30 ENCOUNTER — Other Ambulatory Visit: Payer: Self-pay | Admitting: Physician Assistant

## 2017-10-30 DIAGNOSIS — J301 Allergic rhinitis due to pollen: Secondary | ICD-10-CM

## 2017-10-30 MED ORDER — METHYLPHENIDATE HCL ER (OSM) 54 MG PO TBCR: 54.0000 mg | EXTENDED_RELEASE_TABLET | ORAL | 0 refills | Status: DC

## 2017-10-30 MED ORDER — CETIRIZINE HCL 10 MG PO TABS: 10.0000 mg | ORAL_TABLET | Freq: Every day | ORAL | 3 refills | Status: DC

## 2017-10-30 NOTE — Telephone Encounter (Signed)
Patients father notified and he is ok with that. Wants to know if you will go ahead and send in refills to pharmacy and he will take care of the rest.

## 2017-10-30 NOTE — Telephone Encounter (Signed)
completed

## 2017-11-01 ENCOUNTER — Other Ambulatory Visit: Payer: Self-pay | Admitting: Physician Assistant

## 2017-11-01 DIAGNOSIS — N946 Dysmenorrhea, unspecified: Secondary | ICD-10-CM

## 2017-11-06 ENCOUNTER — Ambulatory Visit: Payer: BC Managed Care – PPO | Admitting: Physician Assistant

## 2017-11-08 ENCOUNTER — Encounter: Payer: Self-pay | Admitting: Physician Assistant

## 2018-03-30 ENCOUNTER — Other Ambulatory Visit: Payer: Self-pay | Admitting: Physician Assistant

## 2018-03-30 DIAGNOSIS — N946 Dysmenorrhea, unspecified: Secondary | ICD-10-CM

## 2018-05-19 ENCOUNTER — Ambulatory Visit: Payer: BC Managed Care – PPO | Admitting: Nurse Practitioner

## 2018-05-19 VITALS — BP 122/77 | HR 83 | Temp 98.8°F | Ht 65.0 in | Wt 139.6 lb

## 2018-05-19 DIAGNOSIS — H65492 Other chronic nonsuppurative otitis media, left ear: Secondary | ICD-10-CM

## 2018-05-19 DIAGNOSIS — J069 Acute upper respiratory infection, unspecified: Secondary | ICD-10-CM

## 2018-05-19 DIAGNOSIS — J029 Acute pharyngitis, unspecified: Secondary | ICD-10-CM

## 2018-05-19 MED ORDER — CHLORPHEN-PE-ACETAMINOPHEN 4-10-325 MG PO TABS: 1.0000 | ORAL_TABLET | Freq: Four times a day (QID) | ORAL | 0 refills | Status: DC | PRN

## 2018-05-19 MED ORDER — AMOXICILLIN-POT CLAVULANATE 875-125 MG PO TABS: 1.0000 | ORAL_TABLET | Freq: Two times a day (BID) | ORAL | 0 refills | Status: DC

## 2018-05-19 NOTE — Progress Notes (Signed)
Subjective:    Patient ID: Natalie Hawkins, female    DOB: 1995/09/26, 22 y.o.   MRN: [id number]   Chief Complaint: Sore Throat; Ear Pain; and Nasal Congestion   HPI Patient is brought in today by her mom. She is complaining of sore throat, chills , ear pain and congestion. started 2-3 days ago. Feels worse today then did yesterday.   Review of Systems  Constitutional: Positive for appetite change and chills. Negative for fever.  HENT: Positive for congestion, ear pain, rhinorrhea, sore throat, trouble swallowing and voice change.   Respiratory: Positive for cough. Negative for shortness of breath.   Cardiovascular: Negative.   Neurological: Positive for headaches.  Psychiatric/Behavioral: Negative.   All other systems reviewed and are negative.      Objective:   Physical Exam  Constitutional: She is oriented to person, place, and time. She appears well-developed and well-nourished.  HENT:  Head: Normocephalic.  Right Ear: Hearing and tympanic membrane normal.  Left Ear: Hearing normal. A middle ear effusion is present.  Mouth/Throat: Uvula is midline, oropharynx is clear and moist and mucous membranes are normal.  Eyes: Pupils are equal, round, and reactive to light. EOM are normal.  Neck: Normal range of motion.  Cardiovascular: Normal rate and regular rhythm.  Pulmonary/Chest: Effort normal and breath sounds normal.  Abdominal: Soft. Bowel sounds are normal.  Neurological: She is alert and oriented to person, place, and time.  Skin: Skin is warm and dry.  Psychiatric: She has a normal mood and affect. Her behavior is normal.  Nursing note and vitals reviewed.  BP 122/77   Pulse 83   Temp 98.8 F (37.1 C) (Oral)   Ht 5\' 5"  (1.651 m)   Wt 139 lb 9.6 oz (63.3 kg)   BMI 23.23 kg/m    Strep negative      Assessment & Plan:  Natalie Hawkins in today with chief complaint of Sore Throat; Ear Pain; and Nasal Congestion   1. Sore throat - Rapid Strep Screen  ([facility] ONLY)  2. Upper respiratory infection with cough and congestion  3. 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 Take meds as prescribed 2. Use a cool mist humidifier especially during the winter months and when heat has been humid. 3. Use saline nose sprays frequently 4. Saline irrigations of the nose can be very helpful if done frequently.  * 4X daily for 1 week*  * Use of a nettie pot can be helpful with this. Follow directions with this* 5. Drink plenty of fluids 6. Keep thermostat turn down low 7.For any cough or congestion  Use plain Mucinex- regular strength or max strength is fine   * Children- consult with Pharmacist for dosing 8. For fever or aces or pains- take tylenol or ibuprofen appropriate for age and weight.  * for fevers greater than 101 orally you may alternate ibuprofen and tylenol every  3 hours.   Meds ordered this encounter  Medications  . amoxicillin-clavulanate (AUGMENTIN) 875-125 MG tablet    Sig: Take 1 tablet by mouth 2 (two) times daily.    Dispense:  20 tablet    Refill:  0    Order Specific Question:   Supervising Provider    Answer:   VINCENT, CAROL L [[id number]]  . Chlorphen-PE-Acetaminophen 4-10-325 MG TABS    Sig: Take 1 tablet by mouth every 6 (six) hours as needed.    Dispense:  20 tablet    Refill:  0    Order Specific Question:   Supervising Provider    Answer:   Eustaquio Maize [id number]   Mary-Margaret Hassell Done, FNP

## 2018-05-19 NOTE — Patient Instructions (Signed)

## 2018-05-21 LAB — RAPID STREP SCREEN (MED CTR MEBANE ONLY): STREP GP A AG, IA W/REFLEX: NEGATIVE

## 2018-05-21 LAB — CULTURE, GROUP A STREP

## 2018-07-03 ENCOUNTER — Telehealth: Payer: Self-pay | Admitting: *Deleted

## 2018-07-09 ENCOUNTER — Ambulatory Visit: Payer: BC Managed Care – PPO | Admitting: Family Medicine

## 2018-07-09 VITALS — BP 122/80 | HR 89 | Temp 98.4°F | Ht 65.0 in | Wt 133.0 lb

## 2018-07-09 DIAGNOSIS — J029 Acute pharyngitis, unspecified: Secondary | ICD-10-CM | POA: Diagnosis not present

## 2018-07-09 LAB — CULTURE, GROUP A STREP

## 2018-07-09 LAB — RAPID STREP SCREEN (MED CTR MEBANE ONLY): STREP GP A AG, IA W/REFLEX: NEGATIVE

## 2018-07-09 MED ORDER — MAGIC MOUTHWASH W/LIDOCAINE: ORAL | 0 refills | Status: DC

## 2018-07-09 NOTE — Patient Instructions (Signed)
Your strep was negative.  I am sending this off to rule out false negative.  You will be contacted with results and prescribed antibiotics pending this result.  In the meantime, I have prescribed you a mouthwash to use for pain and inflammation.   Sore Throat When you have a sore throat, your throat may:  Hurt.  Burn.  Feel irritated.  Feel scratchy.  Many things can cause a sore throat, including:  An infection.  Allergies.  Dryness in the air.  Smoke or pollution.  Gastroesophageal reflux disease (GERD).  A tumor.  A sore throat can be the first sign of another sickness. It can happen with other problems, like coughing or a fever. Most sore throats go away without treatment. Follow these instructions at home:  Take over-the-counter medicines only as told by your doctor.  Drink enough fluids to keep your pee (urine) clear or pale yellow.  Rest when you feel you need to.  To help with pain, try: ? Sipping warm liquids, such as broth, herbal tea, or warm water. ? Eating or drinking cold or frozen liquids, such as frozen ice pops. ? Gargling with a salt-water mixture 3-4 times a day or as needed. To make a salt-water mixture, add -1 tsp of salt in 1 cup of warm water. Mix it until you cannot see the salt anymore. ? Sucking on hard candy or throat lozenges. ? Putting a cool-mist humidifier in your bedroom at night. ? Sitting in the bathroom with the door closed for 5-10 minutes while you run hot water in the shower.  Do not use any tobacco products, such as cigarettes, chewing tobacco, and e-cigarettes. If you need help quitting, ask your doctor. Contact a doctor if:  You have a fever for more than 2-3 days.  You keep having symptoms for more than 2-3 days.  Your throat does not get better in 7 days.  You have a fever and your symptoms suddenly get worse. Get help right away if:  You have trouble breathing.  You cannot swallow fluids, soft foods, or your  saliva.  You have swelling in your throat or neck that gets worse.  You keep feeling like you are going to throw up (vomit).  You keep throwing up. This information is not intended to replace advice given to you by your health care provider. Make sure you discuss any questions you have with your health care provider. Document Released: 05/03/2008 Document Revised: 03/20/2016 Document Reviewed: 05/15/2015 Elsevier Interactive Patient Education  [employer]2018 [employer]

## 2018-07-09 NOTE — Progress Notes (Signed)
Subjective: CC: sore throat PCP: Remus LofflerJones, Angel S, PA-C AVW:UJWJHPI:Natalie Susanne BordersJ Hawkins is a 22 y.o. female presenting to clinic today for:  1. Sore throat Patient reports onset of sore throat over the last couple of days.  She denies any associated headaches, nausea, vomiting or fevers.  She has had some congestion.  The sore throat has gotten somewhat worse today and seems to be more prominent on the right compared to the left.  She reports associated difficulty swallowing, fatigue and some lightheadedness.  She is hydrating without difficulty.  Last menstrual cycle ended about 2 days ago.  She has been using Advil which does seem to help with the discomfort.   ROS: Per HPI  No Known Allergies Past Medical History:  Diagnosis Date  . Allergy   . Asthma    Well-controlled at this time    Current Outpatient Medications:  .  albuterol (PROVENTIL HFA;VENTOLIN HFA) 108 (90 Base) MCG/ACT inhaler, Inhale 2 puffs into the lungs every 6 (six) hours as needed for wheezing or shortness of breath., Disp: 1 Inhaler, Rfl: 5 .  AMETHYST 90-20 MCG tablet, TAKE ONE (1) TABLET EACH DAY, Disp: 28 tablet, Rfl: 0 .  cetirizine (ZYRTEC) 10 MG tablet, Take 1 tablet (10 mg total) by mouth daily., Disp: 90 tablet, Rfl: 3 .  Chlorphen-PE-Acetaminophen 4-10-325 MG TABS, Take 1 tablet by mouth every 6 (six) hours as needed. (Patient not taking: Reported on 07/09/2018), Disp: 20 tablet, Rfl: 0 .  methylphenidate 54 MG PO CR tablet, Take 1 tablet (54 mg total) by mouth every morning., Disp: 30 tablet, Rfl: 0 .  methylphenidate 54 MG PO CR tablet, Take 1 tablet (54 mg total) by mouth every morning., Disp: 30 tablet, Rfl: 0 .  methylphenidate 54 MG PO CR tablet, Take 1 tablet (54 mg total) by mouth every morning., Disp: 30 tablet, Rfl: 0 Social History   Socioeconomic History  . Marital status: Single    Spouse name: Not on file  . Number of children: Not on file  . Years of education: Not on file  . Highest education  level: Not on file  Occupational History  . Not on file  Social Needs  . Financial resource strain: Not on file  . Food insecurity:    Worry: Not on file    Inability: Not on file  . Transportation needs:    Medical: Not on file    Non-medical: Not on file  Tobacco Use  . Smoking status: Never Smoker  . Smokeless tobacco: Never Used  Substance and Sexual Activity  . Alcohol use: No  . Drug use: No  . Sexual activity: Never  Lifestyle  . Physical activity:    Days per week: Not on file    Minutes per session: Not on file  . Stress: Not on file  Relationships  . Social connections:    Talks on phone: Not on file    Gets together: Not on file    Attends religious service: Not on file    Active member of club or organization: Not on file    Attends meetings of clubs or organizations: Not on file    Relationship status: Not on file  . Intimate partner violence:    Fear of current or ex partner: Not on file    Emotionally abused: Not on file    Physically abused: Not on file    Forced sexual activity: Not on file  Other Topics Concern  . Not on file  Social History Narrative  . Not on file   Family History  Problem Relation Age of Onset  . Hypothyroidism Father   . ADD / ADHD Sister   . ADD / ADHD Brother   . Arthritis Maternal Grandmother   . Depression Maternal Grandmother   . Heart disease Maternal Grandmother     Objective: Office vital signs reviewed. BP 122/80   Pulse 89   Temp 98.4 F (36.9 C) (Oral)   Ht 5\' 5"  (1.651 m)   Wt 133 lb (60.3 kg)   BMI 22.13 kg/m   Physical Examination:  General: Awake, alert, well nourished, No acute distress HEENT: Normal    Neck: No masses palpated. + Enlargement of anterior cervical lymph nodes bilaterally.    Ears: Tympanic membranes intact, normal light reflex, no erythema, no bulging    Eyes: PERRLA, extraocular membranes intact, sclera white    Nose: nasal turbinates moist, clear nasal discharge    Throat:  moist mucus membranes, moderate oropharyngeal erythema, grade 2 tonsils with no tonsillar exudate.  Airway is patent Cardio: regular rate and rhythm, S1S2 heard, no murmurs appreciated Pulm: clear to auscultation bilaterally, no wheezes, rhonchi or rales; normal work of breathing on room air  Assessment/ Plan: 21 y.o. female   1. Sore throat Patient is afebrile nontoxic-appearing.  Rapid strep was negative.  This is been sent for culture.  Antibiotics pending culture.  Recommend treating as viral pharyngitis.  Magic mouthwash with lidocaine prescribed.  Home care instructions were reviewed.  Follow-up PRN. - Rapid Strep Screen ([facility] ONLY) - Culture, Group A Strep   Orders Placed This Encounter  Procedures  . Rapid Strep Screen ([facility] ONLY)  . Culture, Group A Strep  . Culture, Group A Strep    Order Specific Question:   Source    Answer:   throat   Meds ordered this encounter  Medications  . magic mouthwash w/lidocaine SOLN    Sig: Gargle and spit 10mL every 6 hours as needed for sore throat    Dispense:  240 mL    Refill:  0    60 mL Lidocaine: 480 mL Magic Mouthwash      Hulen Skains, DO [facility] [phone]

## 2018-07-12 LAB — CULTURE, GROUP A STREP: Strep A Culture: NEGATIVE

## 2018-08-06 ENCOUNTER — Telehealth: Payer: Self-pay | Admitting: Physician Assistant

## 2018-08-06 NOTE — Telephone Encounter (Signed)
Appt with PCP given

## 2018-08-06 NOTE — Telephone Encounter (Signed)
Please address

## 2018-08-09 ENCOUNTER — Encounter: Payer: Self-pay | Admitting: Physician Assistant

## 2018-08-09 ENCOUNTER — Ambulatory Visit: Payer: BC Managed Care – PPO | Admitting: Physician Assistant

## 2018-08-09 VITALS — BP 123/88 | HR 79 | Ht 65.0 in | Wt 132.8 lb

## 2018-08-09 DIAGNOSIS — F902 Attention-deficit hyperactivity disorder, combined type: Secondary | ICD-10-CM | POA: Diagnosis not present

## 2018-08-09 MED ORDER — METHYLPHENIDATE HCL ER (OSM) 54 MG PO TBCR: 54.0000 mg | EXTENDED_RELEASE_TABLET | ORAL | 0 refills | Status: DC

## 2018-08-09 NOTE — Telephone Encounter (Signed)
lmtcb for flu shot 

## 2018-08-09 NOTE — Progress Notes (Signed)
BP 123/88   Pulse 79   Ht 5\' 5"  (1.651 m)   Wt 132 lb 12.8 oz (60.2 kg)   BMI 22.10 kg/m    Subjective:    Patient ID: Natalie Hawkins, female    DOB: 1995/09/04, 23 y.o.   MRN: [id number]  HPI: Natalie Hawkins is a 23 y.o. female presenting on 08/09/2018 for ADHD  Patient comes in for periodic recheck on her ADHD medication.  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  However when she has to set her own schedule such as when she is in training or having to do classes she is in need of the medication.  We will go ahead and refill the medication for the next 3 months.  In 3 months she will already be leaving in [place] for her next job.  I have told her she can give [place] a call or message [place] through my chart for the next 3 refills.  Her family is still local.  Later today she is going to get an Implanon placed for her birth control.  Past Medical History:  Diagnosis Date  . Allergy   . Asthma    Well-controlled at this time   Relevant past medical, surgical, family and social history reviewed and updated as indicated. Interim medical history since our last visit reviewed. Allergies and medications reviewed and updated. DATA REVIEWED: CHART IN EPIC  Family History reviewed for pertinent findings.  Review of Systems  Constitutional: Negative.  Negative for activity change, fatigue and fever.  HENT: Negative.   Eyes: Negative.   Respiratory: Negative.  Negative for cough.   Cardiovascular: Negative.  Negative for chest pain.  Gastrointestinal: Negative.  Negative for abdominal pain.  Endocrine: Negative.   Genitourinary: Negative.  Negative for dysuria.  Musculoskeletal: Negative.   Skin: Negative.   Neurological: Negative.     Allergies as of 08/09/2018   No Known Allergies     Medication List       Accurate as of August 09, 2018  3:17 PM.  Always use your most recent med list.        albuterol 108 (90 Base) MCG/ACT inhaler Commonly known as:  PROVENTIL HFA;VENTOLIN HFA Inhale 2 puffs into the lungs every 6 (six) hours as needed for wheezing or shortness of breath.   AMETHYST 90-20 MCG tablet Generic drug:  levonorgestrel-ethinyl estradiol TAKE ONE (1) TABLET EACH DAY   cetirizine 10 MG tablet Commonly known as:  ZYRTEC Take 1 tablet (10 mg total) by mouth daily.   methylphenidate 54 MG CR tablet Commonly known as:  CONCERTA Take 1 tablet (54 mg total) by mouth every morning.   methylphenidate 54 MG CR tablet Commonly known as:  CONCERTA Take 1 tablet (54 mg total) by mouth every morning.   methylphenidate 54 MG CR tablet Commonly known as:  CONCERTA Take 1 tablet (54 mg total) by mouth every morning.          Objective:    BP 123/88   Pulse 79   Ht 5\' 5"  (1.651 m)   Wt 132 lb 12.8 oz (60.2 kg)   BMI 22.10 kg/m   No Known Allergies  Wt Readings from Last 3 Encounters:  08/09/18 132 lb 12.8 oz (60.2 kg)  07/09/18 133 lb (60.3 kg)  05/19/18 139 lb 9.6  oz (63.3 kg)    Physical Exam Constitutional:      Appearance: She is well-developed.  HENT:     Head: Normocephalic and atraumatic.  Eyes:     Conjunctiva/sclera: Conjunctivae normal.     Pupils: Pupils are equal, round, and reactive to light.  Cardiovascular:     Rate and Rhythm: Normal rate and regular rhythm.     Heart sounds: Normal heart sounds.  Pulmonary:     Effort: Pulmonary effort is normal.     Breath sounds: Normal breath sounds.  Abdominal:     General: Bowel sounds are normal.     Palpations: Abdomen is soft.  Skin:    General: Skin is warm and dry.     Findings: No rash.  Neurological:     Mental Status: She is alert and oriented to person, place, and time.     Deep Tendon Reflexes: Reflexes are normal and symmetric.  Psychiatric:        Behavior: Behavior normal.        Thought Content: Thought content normal.         Judgment: Judgment normal.         Assessment & Plan:   1. Attention deficit hyperactivity disorder (ADHD), combined type - methylphenidate 54 MG PO CR tablet; Take 1 tablet (54 mg total) by mouth every morning.  Dispense: 30 tablet; Refill: 0 - methylphenidate 54 MG PO CR tablet; Take 1 tablet (54 mg total) by mouth every morning.  Dispense: 30 tablet; Refill: 0 - methylphenidate 54 MG PO CR tablet; Take 1 tablet (54 mg total) by mouth every morning.  Dispense: 30 tablet; Refill: 0   Continue all other maintenance medications as listed above.  Follow up plan: No follow-ups on file.  Educational handout given for survey  Remus Loffler PA-C [facility] [address]  [place], [place] [address] [phone]   08/09/2018, 3:17 PM

## 2018-09-10 ENCOUNTER — Encounter: Payer: Self-pay | Admitting: Physician Assistant

## 2018-09-14 ENCOUNTER — Encounter: Payer: Self-pay | Admitting: Physician Assistant

## 2018-09-17 ENCOUNTER — Encounter: Payer: Self-pay | Admitting: Family Medicine

## 2018-12-19 ENCOUNTER — Other Ambulatory Visit: Payer: Self-pay | Admitting: Physician Assistant

## 2019-01-09 IMAGING — DX DG ANKLE COMPLETE 3+V*L*
3 series · 3 of 3 positions shown · non-contrast
Comparison: None.

CLINICAL DATA: Medial and an anterior left ankle pain and swelling
after kicking a piece of equipment with her left foot.

EXAM:
LEFT ANKLE COMPLETE - 3+ VIEW

[ankle ap]
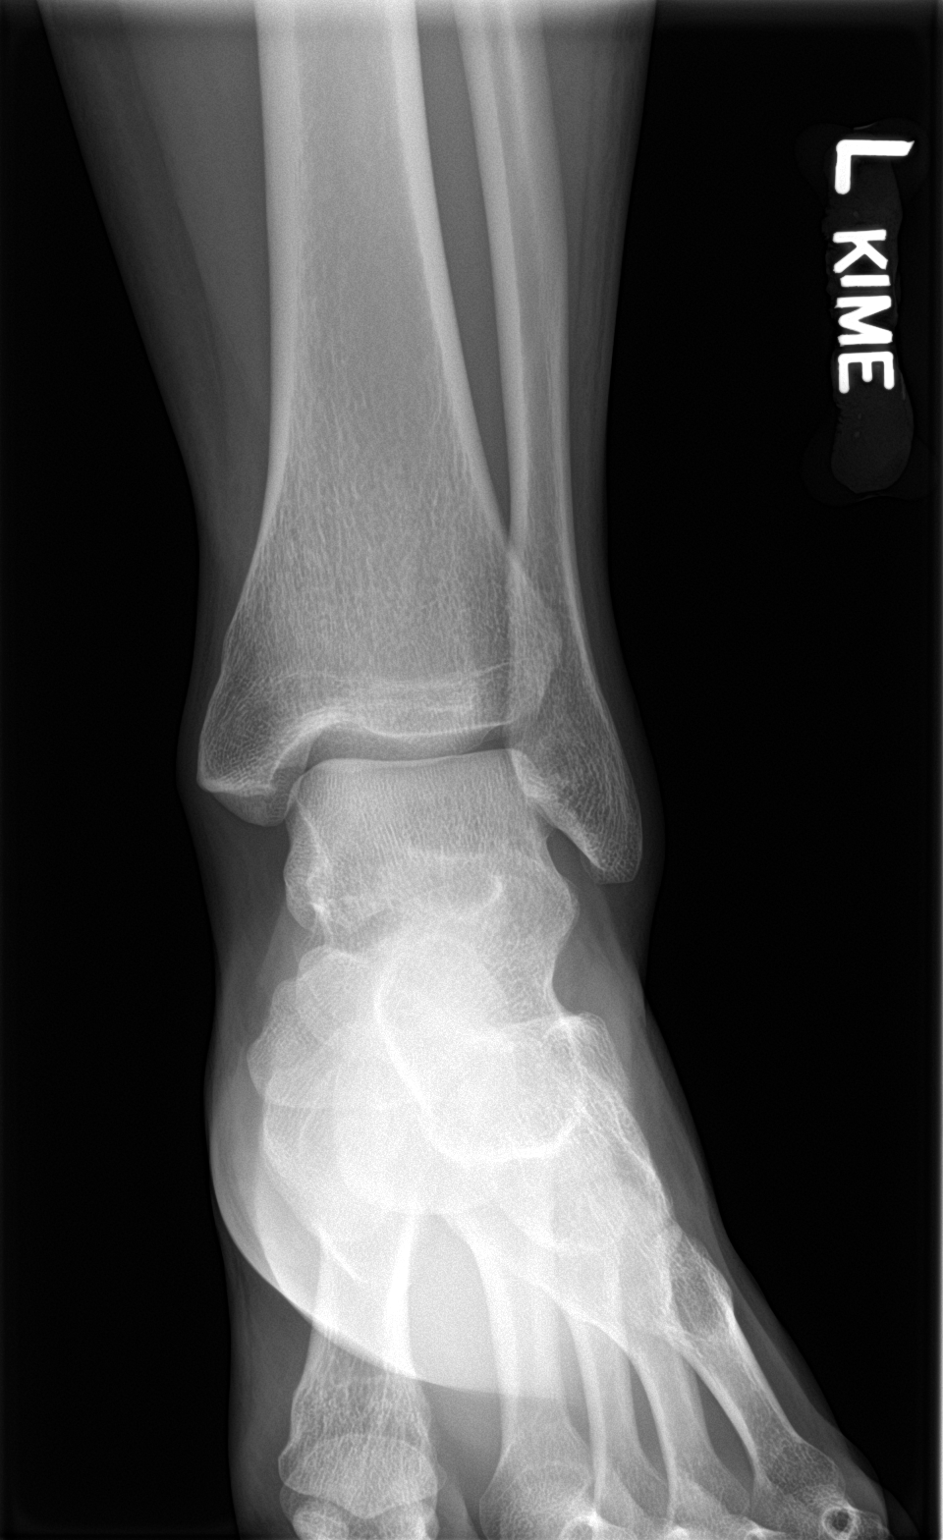

[ankle obl]
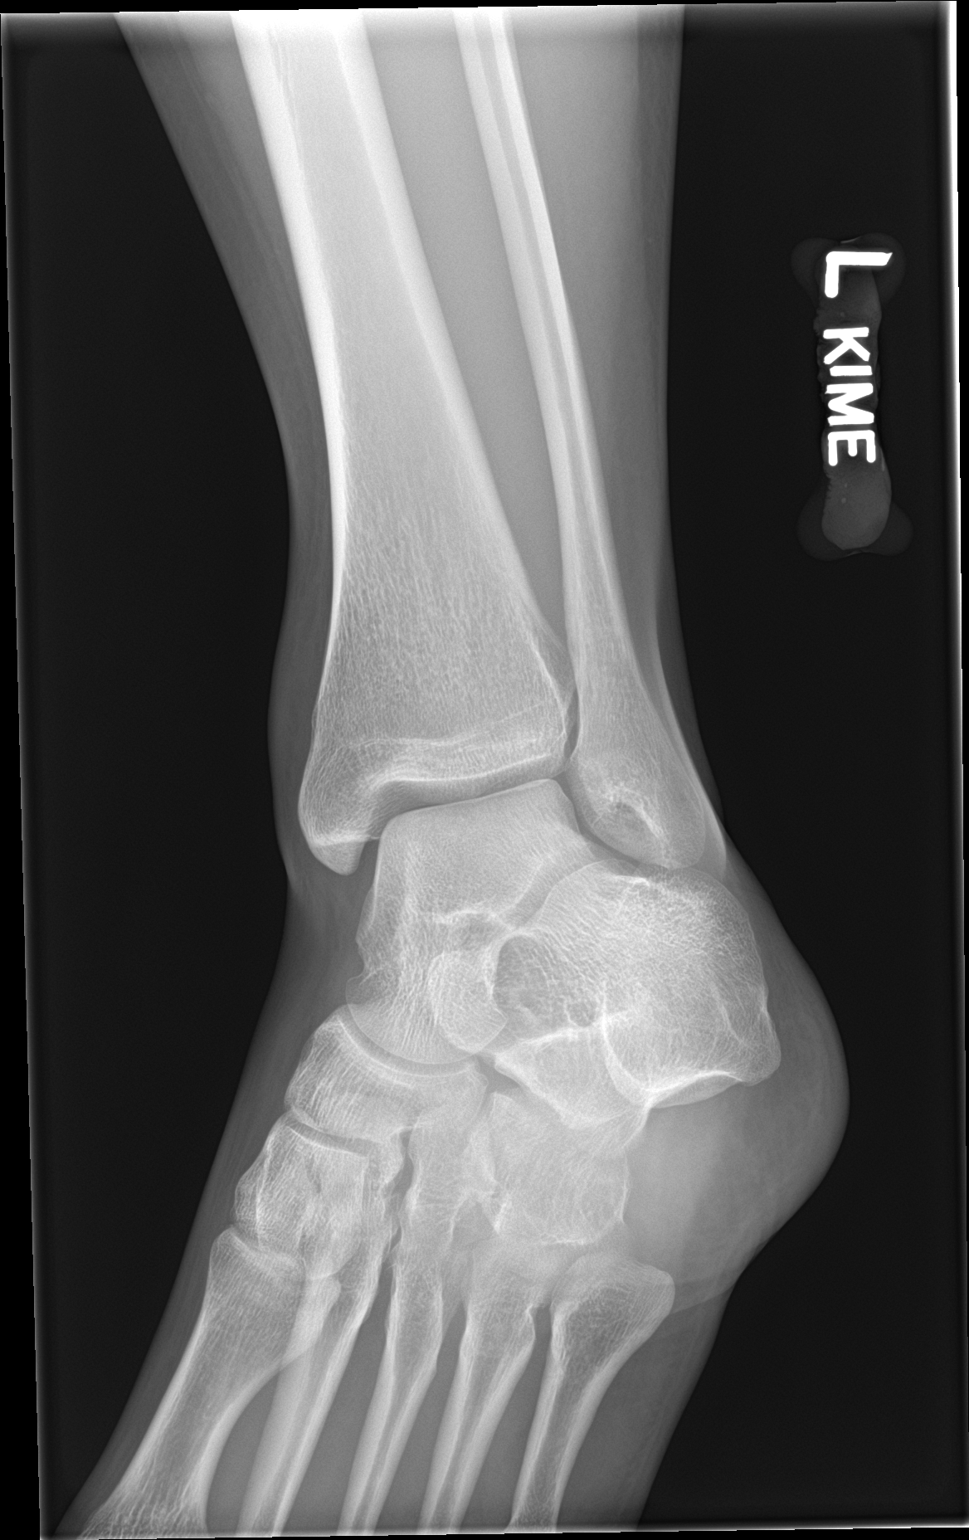

[ankle lat]
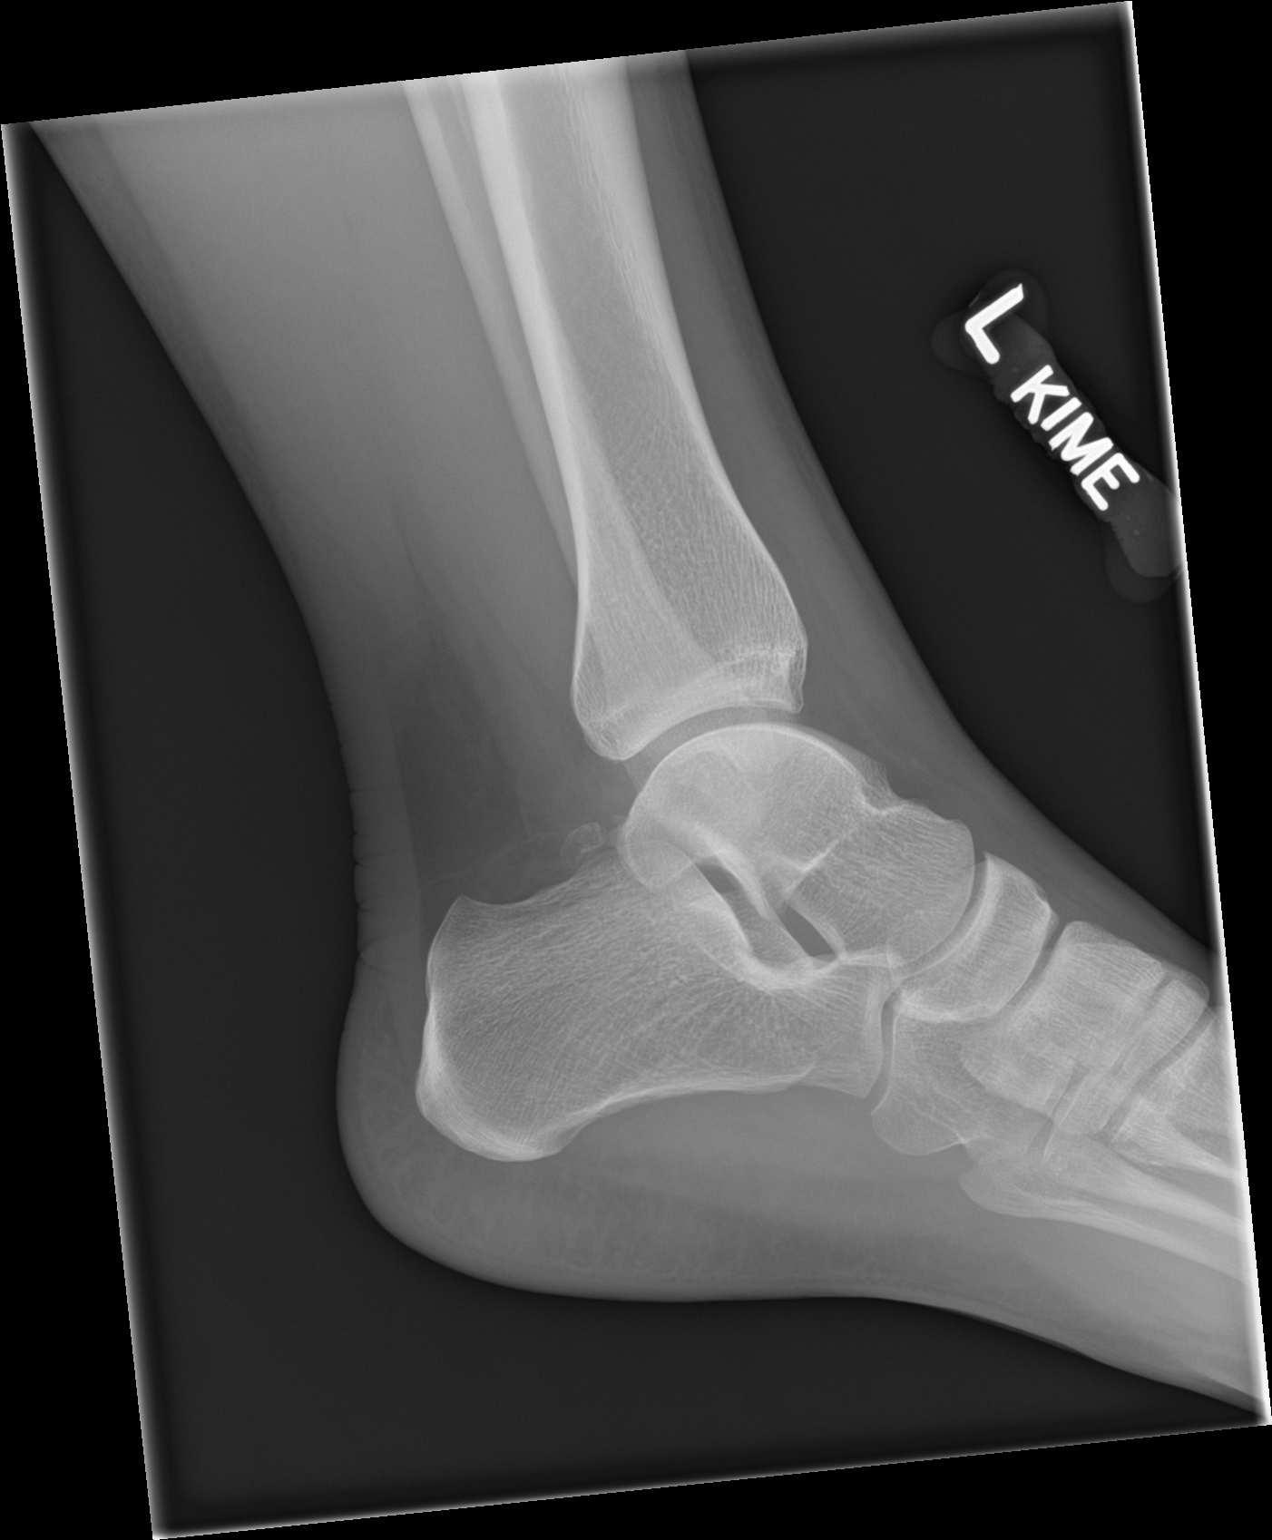

[3 of 3 positions shown; findings below may reference images not displayed]

FINDINGS: Mild diffuse soft tissue swelling. No fracture, dislocation or
effusion.
IMPRESSION: No fracture.

## 2019-01-10 ENCOUNTER — Other Ambulatory Visit: Payer: Self-pay | Admitting: Physician Assistant

## 2019-02-12 ENCOUNTER — Other Ambulatory Visit: Payer: Self-pay | Admitting: Physician Assistant

## 2019-02-12 DIAGNOSIS — F902 Attention-deficit hyperactivity disorder, combined type: Secondary | ICD-10-CM

## 2019-04-17 ENCOUNTER — Other Ambulatory Visit: Payer: Self-pay | Admitting: Physician Assistant

## 2019-04-17 DIAGNOSIS — F902 Attention-deficit hyperactivity disorder, combined type: Secondary | ICD-10-CM

## 2019-10-07 ENCOUNTER — Encounter: Payer: Self-pay | Admitting: Physician Assistant

## 2019-10-07 ENCOUNTER — Ambulatory Visit: Payer: BC Managed Care – PPO | Admitting: Physician Assistant

## 2019-10-07 ENCOUNTER — Other Ambulatory Visit: Payer: Self-pay

## 2019-10-07 VITALS — BP 103/64 | HR 60 | Temp 99.3°F | Ht 65.0 in | Wt 137.8 lb

## 2019-10-07 DIAGNOSIS — F321 Major depressive disorder, single episode, moderate: Secondary | ICD-10-CM

## 2019-10-07 MED ORDER — ESCITALOPRAM OXALATE 10 MG PO TABS: 10.0000 mg | ORAL_TABLET | Freq: Every day | ORAL | 5 refills | Status: DC

## 2019-10-07 NOTE — Progress Notes (Signed)
BP 103/64   Pulse 60   Temp 99.3 F (37.4 C)   Ht 5\' 5"  (1.651 m)   Wt 137 lb 12.8 oz (62.5 kg)   LMP 10/07/2019   SpO2 100%   BMI 22.93 kg/m    Subjective:    Patient ID: 12/07/2019, female    DOB: [birth year], 24 y.o.   MRN: [id number]  HPI 1. Depression, major, single episode, moderate (HCC) lexapro 5 mg one daily   HPI: Natalie Hawkins is a 24 y.o. female presenting on 10/07/2019 for Depression Patient has had a significantly life altering year.  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  Because of Covid her entire life has stopped for the past year.  Since she has been home waiting to go back to work her parents are divorcing.  Her grandmother had passed away within this year.  She does not really know where her next job or work will be.  Depression screen [facility] 2/9 10/07/2019 08/09/2018 05/19/2018 08/04/2016 07/28/2016  Decreased Interest 2 0 0 0 0  Down, Depressed, Hopeless 3 0 0 0 0  PHQ - 2 Score 5 0 0 0 0  Altered sleeping 3 - - - -  Tired, decreased energy 3 - - - -  Change in appetite 3 - - - -  Feeling bad or failure about yourself  2 - - - -  Trouble concentrating 2 - - - -  Moving slowly or fidgety/restless 2 - - - -  Suicidal thoughts 0 - - - -  PHQ-9 Score 20 - - - -  Difficult doing work/chores Extremely dIfficult - - - -   GAD 7 : Generalized Anxiety Score 10/07/2019  Nervous, Anxious, on Edge 3  Control/stop worrying 3  Worry too much - different things 3  Trouble relaxing 2  Restless 2  Easily annoyed or irritable 3  Afraid - awful might happen 3  Total GAD 7 Score 19  Anxiety Difficulty Extremely difficult       Past Medical History:  Diagnosis Date  . Allergy   . Asthma    Well-controlled at this time   Relevant past medical, surgical, family and social history reviewed and updated as indicated. Interim medical history since our last visit reviewed. Allergies and medications  reviewed and updated. DATA REVIEWED: CHART IN EPIC  Family History reviewed for pertinent findings.  Review of Systems  Constitutional: Negative.  Negative for activity change, fatigue and fever.  HENT: Negative.   Eyes: Negative.   Respiratory: Negative.  Negative for cough.   Cardiovascular: Negative.  Negative for chest pain.  Gastrointestinal: Negative.  Negative for abdominal pain.  Endocrine: Negative.   Genitourinary: Negative.  Negative for dysuria.  Musculoskeletal: Negative.   Skin: Negative.   Neurological: Negative.   Psychiatric/Behavioral: Positive for decreased concentration, dysphoric mood and sleep disturbance. The patient is nervous/anxious.     Allergies as of 10/07/2019   No Known Allergies     Medication List       Accurate as of October 07, 2019  8:59 AM. If you have any questions, ask your nurse or doctor.        Amethyst 90-20 MCG tablet Generic drug: levonorgestrel-ethinyl estradiol TAKE ONE (1) TABLET EACH DAY   cetirizine 10 MG tablet Commonly known as: ZYRTEC Take 1 tablet (10 mg total) by mouth daily.   escitalopram 10 MG tablet Commonly known as: Lexapro  Take 1 tablet (10 mg total) by mouth daily. Started by: Terald Sleeper, PA-C   methylphenidate 54 MG CR tablet Commonly known as: CONCERTA Take 1 tablet (54 mg total) by mouth every morning.   methylphenidate 54 MG CR tablet Commonly known as: CONCERTA Take 1 tablet (54 mg total) by mouth every morning.   methylphenidate 54 MG CR tablet Commonly known as: CONCERTA Take 1 tablet (54 mg total) by mouth every morning.   ProAir HFA 108 (90 Base) MCG/ACT inhaler Generic drug: albuterol USE 2 PUFFS EVERY 6 HOURS AS NEEDED          Objective:    BP 103/64   Pulse 60   Temp 99.3 F (37.4 C)   Ht 5\' 5"  (1.651 m)   Wt 137 lb 12.8 oz (62.5 kg)   LMP 10/07/2019   SpO2 100%   BMI 22.93 kg/m   No Known Allergies  Wt Readings from Last 3 Encounters:  10/07/19 137 lb 12.8 oz (62.5  kg)  08/09/18 132 lb 12.8 oz (60.2 kg)  07/09/18 133 lb (60.3 kg)    Physical Exam Constitutional:      General: She is not in acute distress.    Appearance: Normal appearance. She is well-developed.  HENT:     Head: Normocephalic and atraumatic.  Cardiovascular:     Rate and Rhythm: Normal rate.  Pulmonary:     Effort: Pulmonary effort is normal.  Skin:    General: Skin is warm and dry.     Findings: No rash.  Neurological:     Mental Status: She is alert and oriented to person, place, and time.     Deep Tendon Reflexes: Reflexes are normal and symmetric.     Results for orders placed or performed in visit on 07/09/18  Rapid Strep Screen ([facility] ONLY)   Specimen: Other   OTHER  Result Value Ref Range   Strep Gp A Ag, IA W/Reflex Negative Negative  Culture, Group A Strep   OTHER  Result Value Ref Range   Strep A Culture CANCELED   Culture, Group A Strep   Specimen: Throat   THROAT  Result Value Ref Range   Strep A Culture Negative       Assessment & Plan:   1. Depression, major, single episode, moderate (HCC) - escitalopram (LEXAPRO) 10 MG tablet; Take 1 tablet (10 mg total) by mouth daily.  Dispense: 30 tablet; Refill: 5   Continue all other maintenance medications as listed above.  Follow up plan: Return in about 4 weeks (around 11/04/2019).  Educational handout given for stress  Terald Sleeper PA-C [facility] [address]  [place], [place] [address] [phone]   10/07/2019, 8:59 AM

## 2019-10-07 NOTE — Patient Instructions (Signed)
Stress, Adult Stress is a normal reaction to life events. Stress is what you feel when life demands more than you are used to, or more than you think you can handle. Some stress can be useful, such as studying for a test or meeting a deadline at work. Stress that occurs too often or for too long can cause problems. It can affect your emotional health and interfere with relationships and normal daily activities. Too much stress can weaken your body's defense system (immune system) and increase your risk for physical illness. If you already have a medical problem, stress can make it worse. What are the causes? All sorts of life events can cause stress. An event that causes stress for one person may not be stressful for another person. Major life events, whether positive or negative, commonly cause stress. Examples include:  Losing a job or starting a new job.  Losing a loved one.  Moving to a new town or home.  Getting married or divorced.  Having a baby.  Getting injured or sick. Less obvious life events can also cause stress, especially if they occur day after day or in combination with each other. Examples include:  Working long hours.  Driving in traffic.  Caring for children.  Being in debt.  Being in a difficult relationship. What are the signs or symptoms? Stress can cause emotional symptoms, including:  Anxiety. This is feeling worried, afraid, on edge, overwhelmed, or out of control.  Anger, including irritation or impatience.  Depression. This is feeling sad, down, helpless, or guilty.  Trouble focusing, remembering, or making decisions. Stress can cause physical symptoms, including:  Aches and pains. These may affect your head, neck, back, stomach, or other areas of your body.  Tight muscles or a clenched jaw.  Low energy.  Trouble sleeping. Stress can cause unhealthy behaviors, including:  Eating to feel better (overeating) or skipping meals.  Working too  much or putting off tasks.  Smoking, drinking alcohol, or using drugs to feel better. How is this diagnosed? Stress is diagnosed through an assessment by your health care provider. He or she may diagnose this condition based on:  Your symptoms and any stressful life events.  Your medical history.  Tests to rule out other causes of your symptoms. Depending on your condition, your health care provider may refer you to a specialist for further evaluation. How is this treated?  Stress management techniques are the recommended treatment for stress. Medicine is not typically recommended for the treatment of stress. Techniques to reduce your reaction to stressful life events include:  Stress identification. Monitor yourself for symptoms of stress and identify what causes stress for you. These skills may help you to avoid or prepare for stressful events.  Time management. Set your priorities, keep a calendar of events, and learn to say no. Taking these actions can help you avoid making too many commitments. Techniques for coping with stress include:  Rethinking the problem. Try to think realistically about stressful events rather than ignoring them or overreacting. Try to find the positives in a stressful situation rather than focusing on the negatives.  Exercise. Physical exercise can release both physical and emotional tension. The key is to find a form of exercise that you enjoy and do it regularly.  Relaxation techniques. These relax the body and mind. The key is to find one or more that you enjoy and use the techniques regularly. Examples include: ? Meditation, deep breathing, or progressive relaxation techniques. ? Yoga or   tai chi. ? Biofeedback, mindfulness techniques, or journaling. ? Listening to music, being out in nature, or participating in other hobbies.  Practicing a healthy lifestyle. Eat a balanced diet, drink plenty of water, limit or avoid caffeine, and get plenty of  sleep.  Having a strong support network. Spend time with family, friends, or other people you enjoy being around. Express your feelings and talk things over with someone you trust. Counseling or talk therapy with a mental health professional may be helpful if you are having trouble managing stress on your own. Follow these instructions at home: Lifestyle   Avoid drugs.  Do not use any products that contain nicotine or tobacco, such as cigarettes, e-cigarettes, and chewing tobacco. If you need help quitting, ask your health care provider.  Limit alcohol intake to no more than 1 drink a day for nonpregnant women and 2 drinks a day for men. One drink equals 12 oz of beer, 5 oz of wine, or 1 oz of hard liquor  Do not use alcohol or drugs to relax.  Eat a balanced diet that includes fresh fruits and vegetables, whole grains, lean meats, fish, eggs, and beans, and low-fat dairy. Avoid processed foods and foods high in added fat, sugar, and salt.  Exercise at least 30 minutes on 5 or more days each week.  Get 7-8 hours of sleep each night. General instructions   Practice stress management techniques as discussed with your health care provider.  Drink enough fluid to keep your urine clear or pale yellow.  Take over-the-counter and prescription medicines only as told by your health care provider.  Keep all follow-up visits as told by your health care provider. This is important. Contact a health care provider if:  Your symptoms get worse.  You have new symptoms.  You feel overwhelmed by your problems and can no longer manage them on your own. Get help right away if:  You have thoughts of hurting yourself or others. If you ever feel like you may hurt yourself or others, or have thoughts about taking your own life, get help right away. You can go to your nearest emergency department or call:  Your local emergency services (911 in the [place]  A suicide crisis helpline, such as the  [place] at (316) 250-6172. This is open 24 hours a day. Summary  Stress is a normal reaction to life events. It can cause problems if it happens too often or for too long.  Practicing stress management techniques is the best way to treat stress.  Counseling or talk therapy with a mental health professional may be helpful if you are having trouble managing stress on your own. This information is not intended to replace advice given to you by your health care provider. Make sure you discuss any questions you have with your health care provider. Document Revised: 02/22/2019 Document Reviewed: 09/14/2016 Elsevier Patient Education  [place].

## 2019-11-12 ENCOUNTER — Ambulatory Visit: Payer: BC Managed Care – PPO | Admitting: Physician Assistant

## 2019-11-12 ENCOUNTER — Ambulatory Visit: Payer: BC Managed Care – PPO | Admitting: Family Medicine

## 2019-11-12 ENCOUNTER — Other Ambulatory Visit: Payer: Self-pay

## 2019-11-12 ENCOUNTER — Encounter: Payer: Self-pay | Admitting: Family Medicine

## 2019-11-12 VITALS — BP 98/60 | HR 70 | Temp 98.6°F | Ht 65.0 in | Wt 137.0 lb

## 2019-11-12 DIAGNOSIS — Z79899 Other long term (current) drug therapy: Secondary | ICD-10-CM | POA: Diagnosis not present

## 2019-11-12 DIAGNOSIS — F902 Attention-deficit hyperactivity disorder, combined type: Secondary | ICD-10-CM | POA: Diagnosis not present

## 2019-11-12 DIAGNOSIS — J45990 Exercise induced bronchospasm: Secondary | ICD-10-CM

## 2019-11-12 DIAGNOSIS — F321 Major depressive disorder, single episode, moderate: Secondary | ICD-10-CM | POA: Diagnosis not present

## 2019-11-12 DIAGNOSIS — Z7689 Persons encountering health services in other specified circumstances: Secondary | ICD-10-CM

## 2019-11-12 MED ORDER — METHYLPHENIDATE HCL ER (OSM) 54 MG PO TBCR: 54.0000 mg | EXTENDED_RELEASE_TABLET | ORAL | 0 refills | Status: DC

## 2019-11-12 MED ORDER — ALBUTEROL SULFATE HFA 108 (90 BASE) MCG/ACT IN AERS: INHALATION_SPRAY | RESPIRATORY_TRACT | 2 refills | Status: DC

## 2019-11-12 NOTE — Progress Notes (Addendum)
Subjective: CC: follow up depression/ ADHD PCP: Terald Sleeper, PA-C TDV:VOHY Natalie Hawkins is a 24 y.o. female presenting to clinic today for:  1. ADHD, combined Patient reports onset of ADHD in third grade.  She is been well controlled on Concerta 54 mg daily.  She typically takes this when she either is in class or at work.  She otherwise does not take the medicine because she does not want to be dependent on it.  Does not report any dry mouth, heart palpitations or increased anxiety.  2. Depression Patient was seen 1 month ago for depression.  Depressive symptoms seem to have been precipitated by events due to COVID-19.  She lost her job and grandmother last year, and her parents are going through a divorce.  She was started on Lexapro 10 mg daily.  She is here for interval follow-up.  She notes that she has been doing extremely well on the Lexapro 10 mg daily.  She feels like she has good support from her brother and sister, whom she has been talking to about current home issues.  She has moved back home and seems to be doing well.  No GI intolerance to the Lexapro.  3.  Exercise-induced asthma Patient reports good control with as needed use of albuterol inhaler.  She uses this prior to exercise.  Last refill was in June 2020.  She would like to get her albuterol inhaler renewed.  ROS: Per HPI  No Known Allergies Past Medical History:  Diagnosis Date  . Allergy   . Asthma    Well-controlled at this time    Current Outpatient Medications:  .  AMETHYST 90-20 MCG tablet, TAKE ONE (1) TABLET EACH DAY (Patient not taking: Reported on 10/07/2019), Disp: 28 tablet, Rfl: 0 .  cetirizine (ZYRTEC) 10 MG tablet, Take 1 tablet (10 mg total) by mouth daily., Disp: 90 tablet, Rfl: 3 .  escitalopram (LEXAPRO) 10 MG tablet, Take 1 tablet (10 mg total) by mouth daily., Disp: 30 tablet, Rfl: 5 .  methylphenidate 54 MG PO CR tablet, Take 1 tablet (54 mg total) by mouth every morning., Disp: 30 tablet,  Rfl: 0 .  methylphenidate 54 MG PO CR tablet, Take 1 tablet (54 mg total) by mouth every morning., Disp: 30 tablet, Rfl: 0 .  methylphenidate 54 MG PO CR tablet, Take 1 tablet (54 mg total) by mouth every morning., Disp: 30 tablet, Rfl: 0 .  PROAIR HFA 108 (90 Base) MCG/ACT inhaler, USE 2 PUFFS EVERY 6 HOURS AS NEEDED, Disp: 8.5 g, Rfl: 0 Social History   Socioeconomic History  . Marital status: Single    Spouse name: Not on file  . Number of children: Not on file  . Years of education: Not on file  . Highest education level: Not on file  Occupational History  . Not on file  Tobacco Use  . Smoking status: Never Smoker  . Smokeless tobacco: Never Used  Substance and Sexual Activity  . Alcohol use: No  . Drug use: No  . Sexual activity: Never  Other Topics Concern  . Not on file  Social History Narrative  . Not on file   Social Determinants of Health   Financial Resource Strain:   . Difficulty of Paying Living Expenses:   Food Insecurity:   . Worried About Charity fundraiser in the Last Year:   . Arboriculturist in the Last Year:   Transportation Needs:   . Lack of Transportation (  Medical):   Marland Kitchen Lack of Transportation (Non-Medical):   Physical Activity:   . Days of Exercise per Week:   . Minutes of Exercise per Session:   Stress:   . Feeling of Stress :   Social Connections:   . Frequency of Communication with Friends and Family:   . Frequency of Social Gatherings with Friends and Family:   . Attends Religious Services:   . Active Member of Clubs or Organizations:   . Attends Banker Meetings:   Marland Kitchen Marital Status:   Intimate Partner Violence:   . Fear of Current or Ex-Partner:   . Emotionally Abused:   Marland Kitchen Physically Abused:   . Sexually Abused:    Family History  Problem Relation Age of Onset  . Hypothyroidism Father   . ADD / ADHD Sister   . ADD / ADHD Brother   . Arthritis Maternal Grandmother   . Depression Maternal Grandmother   . Heart  disease Maternal Grandmother     Objective: Office vital signs reviewed. BP 98/60   Pulse 70   Temp 98.6 F (37 C) (Temporal)   Ht 5\' 5"  (1.651 m)   Wt 137 lb (62.1 kg)   LMP 10/29/2019 (Exact Date)   SpO2 98%   BMI 22.80 kg/m   Physical Examination:  General: Awake, alert, well nourished, No acute distress HEENT: Normal, sclera white Cardio: regular rate  Pulm: Normal work of breathing on room air Psych: Mood stable, speech normal, affect appropriate, pleasant, interactive, good eye contact  Depression screen [facility] 2/9 11/12/2019 10/07/2019 08/09/2018  Decreased Interest 0 2 0  Down, Depressed, Hopeless 0 3 0  PHQ - 2 Score 0 5 0  Altered sleeping 1 3 -  Tired, decreased energy 0 3 -  Change in appetite 0 3 -  Feeling bad or failure about yourself  0 2 -  Trouble concentrating 0 2 -  Moving slowly or fidgety/restless 0 2 -  Suicidal thoughts 0 0 -  PHQ-9 Score 1 20 -  Difficult doing work/chores Not difficult at all Extremely dIfficult -   GAD 7 : Generalized Anxiety Score 11/12/2019 10/07/2019  Nervous, Anxious, on Edge 0 3  Control/stop worrying 0 3  Worry too much - different things 0 3  Trouble relaxing 0 2  Restless 0 2  Easily annoyed or irritable 0 3  Afraid - awful might happen 0 3  Total GAD 7 Score 0 19  Anxiety Difficulty Not difficult at all Extremely difficult   Assessment/ Plan: 24 y.o. female   1. Attention deficit hyperactivity disorder (ADHD), combined type Stable.  CSC and UDS obtained per office policy.  Patient has Nexplanon in for contraception.  Concerta refilled and patient will see me as needed this medication. - ToxASSURE Select 13 (MW), Urine - methylphenidate 54 MG PO CR tablet; Take 1 tablet (54 mg total) by mouth every morning.  Dispense: 30 tablet; Refill: 0 - methylphenidate 54 MG PO CR tablet; Take 1 tablet (54 mg total) by mouth every morning.  Dispense: 30 tablet; Refill: 0 - methylphenidate 54 MG PO CR tablet; Take 1 tablet (54 mg total)  by mouth every morning.  Dispense: 30 tablet; Refill: 0  2. Controlled substance agreement signed - ToxASSURE Select 13 (MW), Urine  3. Depression, major, single episode, moderate (HCC) Improved.  Continue Lexapro.  No refills needed at this time.  She may follow-up in 6 months  4. Establishing care with new doctor, encounter for  5. Exercise-induced  asthma Stable with as needed use of albuterol - albuterol (PROAIR HFA) 108 (90 Base) MCG/ACT inhaler; USE 2 PUFFS EVERY 6 HOURS AS NEEDED  Dispense: 8.5 g; Refill: 2   Orders Placed This Encounter  Procedures  . ToxASSURE Select 13 (MW), Urine   No orders of the defined types were placed in this encounter.  The Narcotic Database has been reviewed.  There were no red flags.     Raliegh Ip, DO [facility] [phone]

## 2019-11-12 NOTE — Patient Instructions (Signed)
It was a pleasure seeing you today, [name].  Information regarding what we discussed is included in this packet.  Please make an appointment to see me in 3 months (we can do your full physical with fasting labs/ pap and renew your medications if needed at that time).  Please feel free to call our office if any questions or concerns arise.  Warm Regards, Kennet Mccort M. Bassel Gaskill, DO   Pap Test Why am I having this test? A Pap test, also called a Pap smear, is a screening test to check for signs of:  Cancer of the vagina, cervix, and uterus. The cervix is the lower part of the uterus that opens into the vagina.  Infection.  Changes that may be a sign that cancer is developing (precancerous changes). Women need this test on a regular basis. In general, you should have a Pap test every 3 years until you reach menopause or age 50. Women aged 30-60 may choose to have their Pap test done at the same time as an HPV (human papillomavirus) test every 5 years (instead of every 3 years). Your health care provider may recommend having Pap tests more or less often depending on your medical conditions and past Pap test results. What kind of sample is taken?  Your health care provider will collect a sample of cells from the surface of your cervix. This will be done using a small cotton swab, plastic spatula, or brush. This sample is often collected during a pelvic exam, when you are lying on your back on an exam table with feet in footrests (stirrups). In some cases, fluids (secretions) from the cervix or vagina may also be collected. How do I prepare for this test?  Be aware of where you are in your menstrual cycle. If you are menstruating on the day of the test, you may be asked to reschedule.  You may need to reschedule if you have a known vaginal infection on the day of the test.  Follow instructions from your health care provider about: ? Changing or stopping your regular medicines. Some medicines  can cause abnormal test results, such as digitalis and tetracycline. ? Avoiding douching or taking a bath the day before or the day of the test. Tell a health care provider about:  Any allergies you have.  All medicines you are taking, including vitamins, herbs, eye drops, creams, and over-the-counter medicines.  Any blood disorders you have.  Any surgeries you have had.  Any medical conditions you have.  Whether you are pregnant or may be pregnant. How are the results reported? Your test results will be reported as either abnormal or normal. A false-positive result can occur. A false positive is incorrect because it means that a condition is present when it is not. A false-negative result can occur. A false negative is incorrect because it means that a condition is not present when it is. What do the results mean? A normal test result means that you do not have signs of cancer of the vagina, cervix, or uterus. An abnormal result may mean that you have:  Cancer. A Pap test by itself is not enough to diagnose cancer. You will have more tests done in this case.  Precancerous changes in your vagina, cervix, or uterus.  Inflammation of the cervix.  An STD (sexually transmitted disease).  A fungal infection.  A parasite infection. Talk with your health care provider about what your results mean. Questions to ask your health care provider Ask  your health care provider, or the department that is doing the test:  When will my results be ready?  How will I get my results?  What are my treatment options?  What other tests do I need?  What are my next steps? Summary  In general, women should have a Pap test every 3 years until they reach menopause or age 43.  Your health care provider will collect a sample of cells from the surface of your cervix. This will be done using a small cotton swab, plastic spatula, or brush.  In some cases, fluids (secretions) from the cervix or  vagina may also be collected. This information is not intended to replace advice given to you by your health care provider. Make sure you discuss any questions you have with your health care provider. Document Revised: 04/03/2017 Document Reviewed: 04/03/2017 Elsevier Patient Education  Natalie Hawkins.

## 2019-11-14 LAB — TOXASSURE SELECT 13 (MW), URINE

## 2019-11-20 ENCOUNTER — Other Ambulatory Visit: Payer: Self-pay | Admitting: Dermatology

## 2020-06-16 ENCOUNTER — Other Ambulatory Visit: Payer: Self-pay | Admitting: Dermatology

## 2020-07-16 ENCOUNTER — Telehealth: Payer: Self-pay

## 2020-08-12 ENCOUNTER — Ambulatory Visit: Payer: Self-pay | Admitting: Family Medicine

## 2020-08-12 ENCOUNTER — Encounter: Payer: BC Managed Care – PPO | Admitting: Family Medicine

## 2020-08-17 ENCOUNTER — Encounter: Payer: Self-pay | Admitting: Family Medicine

## 2020-08-17 ENCOUNTER — Ambulatory Visit (INDEPENDENT_AMBULATORY_CARE_PROVIDER_SITE_OTHER): Payer: Self-pay | Admitting: Family Medicine

## 2020-08-17 ENCOUNTER — Other Ambulatory Visit: Payer: Self-pay

## 2020-08-17 DIAGNOSIS — U071 COVID-19: Secondary | ICD-10-CM

## 2020-08-17 NOTE — Patient Instructions (Signed)
You have met CDC guidelines for return. Please seek immediate medical attention if any of your symptoms return Work note provided  Follow up as needed

## 2020-08-17 NOTE — Progress Notes (Signed)
Telephone visit  Subjective: CC: COVID infection PCP: Janora Norlander, DO DTO:IZTI Natalie Hawkins is a 25 y.o. female calls for telephone consult today. Patient provides verbal consent for consult held via phone.  Due to COVID-19 pandemic this visit was conducted virtually. This visit type was conducted due to national recommendations for restrictions regarding the COVID-19 Pandemic (e.g. social distancing, sheltering in place) in an effort to limit this patient's exposure and mitigate transmission in our community. All issues noted in this document were discussed and addressed.  A physical exam was not performed with this format.   Location of patient: home Location of provider: [facility] Others present for call: none  1.  COVID-19 infection Patient reports onset of symptoms 1 month ago.  She formally tested positive for COVID-19 on the third of the month.  She reports symptomatic improvement.  She is afebrile without antipyretics.  Normal work of breathing.  No concerns.  She has tested negative for COVID x4 by home testing.  She is asking for work note to return.   ROS: Per HPI  No Known Allergies Past Medical History:  Diagnosis Date  . Allergy   . Asthma    Well-controlled at this time  . Depression     Current Outpatient Medications:  .  albuterol (PROAIR HFA) 108 (90 Base) MCG/ACT inhaler, USE 2 PUFFS EVERY 6 HOURS AS NEEDED, Disp: 8.5 g, Rfl: 2 .  escitalopram (LEXAPRO) 10 MG tablet, Take 1 tablet (10 mg total) by mouth daily., Disp: 30 tablet, Rfl: 5 .  methylphenidate 54 MG PO CR tablet, Take 1 tablet (54 mg total) by mouth every morning., Disp: 30 tablet, Rfl: 0 .  methylphenidate 54 MG PO CR tablet, Take 1 tablet (54 mg total) by mouth every morning., Disp: 30 tablet, Rfl: 0 .  methylphenidate 54 MG PO CR tablet, Take 1 tablet (54 mg total) by mouth every morning., Disp: 30 tablet, Rfl: 0 .  minocycline (MINOCIN) 100 MG capsule, Take 100 mg by mouth 2 (two) times daily.,  Disp: , Rfl:   Assessment/ Plan: 25 y.o. female   COVID-19  Patient has met CDC guidelines.  She may return to work without restriction.  Work note provided and placed in her 43.  She will follow-up as needed  Start time: 1:46pm End time: 1:51pm  Total time spent on patient care (including telephone call/ virtual visit): 5 minutes  [place], [place] [phone]

## 2020-11-02 ENCOUNTER — Telehealth: Payer: Self-pay

## 2020-12-28 ENCOUNTER — Other Ambulatory Visit: Payer: Self-pay | Admitting: Family Medicine

## 2020-12-28 DIAGNOSIS — F321 Major depressive disorder, single episode, moderate: Secondary | ICD-10-CM

## 2021-01-09 ENCOUNTER — Encounter: Payer: Self-pay | Admitting: Family Medicine

## 2021-01-12 ENCOUNTER — Encounter: Payer: Self-pay | Admitting: Family Medicine

## 2021-01-12 ENCOUNTER — Ambulatory Visit (INDEPENDENT_AMBULATORY_CARE_PROVIDER_SITE_OTHER): Payer: Self-pay | Admitting: Family Medicine

## 2021-01-12 DIAGNOSIS — Z91199 Patient's noncompliance with other medical treatment and regimen due to unspecified reason: Secondary | ICD-10-CM

## 2021-01-12 DIAGNOSIS — J45990 Exercise induced bronchospasm: Secondary | ICD-10-CM

## 2021-01-12 DIAGNOSIS — F321 Major depressive disorder, single episode, moderate: Secondary | ICD-10-CM

## 2021-01-12 DIAGNOSIS — Z5329 Procedure and treatment not carried out because of patient's decision for other reasons: Secondary | ICD-10-CM

## 2021-01-12 MED ORDER — ESCITALOPRAM OXALATE 10 MG PO TABS: 10.0000 mg | ORAL_TABLET | Freq: Every day | ORAL | 1 refills | Status: DC

## 2021-01-12 NOTE — Addendum Note (Signed)
Addended by: Raliegh Ip on: 01/12/2021 01:01 PM   Modules accepted: Orders

## 2021-01-12 NOTE — Progress Notes (Signed)
MyChart Video visit  No show.  Will be glad to refill meds until she can be seen in office due to phone difficulties abroad.  Start time: 12:01pm (sent text); 12:04pm (tried to call but goes straight to voicemail, which is full); called mom and left VM there  Roshawn Lacina Hulen Skains, DO [facility] [phone]

## 2021-03-24 ENCOUNTER — Ambulatory Visit: Payer: Self-pay | Admitting: Family Medicine

## 2021-04-21 ENCOUNTER — Encounter: Payer: Self-pay | Admitting: Family Medicine

## 2021-04-21 ENCOUNTER — Telehealth (INDEPENDENT_AMBULATORY_CARE_PROVIDER_SITE_OTHER): Payer: Self-pay | Admitting: Family Medicine

## 2021-04-21 DIAGNOSIS — L700 Acne vulgaris: Secondary | ICD-10-CM

## 2021-04-21 DIAGNOSIS — J019 Acute sinusitis, unspecified: Secondary | ICD-10-CM

## 2021-04-21 DIAGNOSIS — F321 Major depressive disorder, single episode, moderate: Secondary | ICD-10-CM

## 2021-04-21 DIAGNOSIS — B9689 Other specified bacterial agents as the cause of diseases classified elsewhere: Secondary | ICD-10-CM

## 2021-04-21 DIAGNOSIS — F902 Attention-deficit hyperactivity disorder, combined type: Secondary | ICD-10-CM

## 2021-04-21 MED ORDER — AMOXICILLIN-POT CLAVULANATE 875-125 MG PO TABS: 1.0000 | ORAL_TABLET | Freq: Two times a day (BID) | ORAL | 0 refills | Status: DC

## 2021-04-21 MED ORDER — BUPROPION HCL ER (SR) 150 MG PO TB12: 150.0000 mg | ORAL_TABLET | Freq: Every morning | ORAL | 1 refills | Status: DC

## 2021-04-21 MED ORDER — MINOCYCLINE HCL 100 MG PO CAPS: 100.0000 mg | ORAL_CAPSULE | Freq: Two times a day (BID) | ORAL | 3 refills | Status: DC

## 2021-04-21 MED ORDER — ESCITALOPRAM OXALATE 10 MG PO TABS: 10.0000 mg | ORAL_TABLET | Freq: Every day | ORAL | 3 refills | Status: DC

## 2021-04-21 NOTE — Progress Notes (Signed)
MyChart Video visit  Subjective: CC: ADHD PCP: Raliegh Ip, DO Natalie Hawkins is a 25 y.o. female. Patient provides verbal consent for consult held via video.  Due to COVID-19 pandemic this visit was conducted virtually. This visit type was conducted due to national recommendations for restrictions regarding the COVID-19 Pandemic (e.g. social distancing, sheltering in place) in an effort to limit this patient's exposure and mitigate transmission in our community. All issues noted in this document were discussed and addressed.  A physical exam was not performed with this format.   Location of patient: home2 Location of provider: [facility] Others present for call: none  1. ADHD Patient reports that the concerta seems to be a little too much now.  She wants to transition to something a little less intense.  She feels a little too flat with the medication, particularly at work.  She is currently home for the month but will be going back out again after the new year.  She is compliant with Lexapro.  Has never been on any other medication besides Concerta.  Would be interested in trying something noncontrolled since her job makes it difficult to get into the office for an office visits  2.  Sinusitis Patient reports facial pressure, headache.  This is been ongoing for several days now seems to be getting slightly worse.  Asking for antibiotic to treat.  No reports of fevers, cough or congestion.  Does not feel like when she had corona  3.  Acne Patient reports good control of acne with use of minocycline.  Uses this at least monthly.  Menstrual cycles are normal.  She uses a NuvaRing  ROS: Per HPI  No Known Allergies Past Medical History:  Diagnosis Date   Allergy    Asthma    Well-controlled at this time   Depression     Current Outpatient Medications:    albuterol (PROAIR HFA) 108 (90 Base) MCG/ACT inhaler, USE 2 PUFFS EVERY 6 HOURS AS NEEDED, Disp: 8.5 g, Rfl: 2   escitalopram  (LEXAPRO) 10 MG tablet, Take 1 tablet (10 mg total) by mouth daily. MUST be seen in office for further fills, Disp: 90 tablet, Rfl: 1   methylphenidate 54 MG PO CR tablet, Take 1 tablet (54 mg total) by mouth every morning., Disp: 30 tablet, Rfl: 0   methylphenidate 54 MG PO CR tablet, Take 1 tablet (54 mg total) by mouth every morning., Disp: 30 tablet, Rfl: 0   methylphenidate 54 MG PO CR tablet, Take 1 tablet (54 mg total) by mouth every morning., Disp: 30 tablet, Rfl: 0   minocycline (MINOCIN) 100 MG capsule, Take 100 mg by mouth 2 (two) times daily., Disp: , Rfl:   Assessment/ Plan: 25 y.o. female   Attention deficit hyperactivity disorder (ADHD), combined type - Plan: buPROPion (WELLBUTRIN SR) 150 MG 12 hr tablet  Depression, major, single episode, moderate (HCC) - Plan: buPROPion (WELLBUTRIN SR) 150 MG 12 hr tablet, escitalopram (LEXAPRO) 10 MG tablet  Acute bacterial sinusitis - Plan: amoxicillin-clavulanate (AUGMENTIN) 875-125 MG tablet  Acne vulgaris - Plan: minocycline (MINOCIN) 100 MG capsule  Trial of Wellbutrin instead of the Concerta for ADHD.  We discussed alternatives including Strattera and even reduced dose of Concerta.  If she is not finding really any improvement in her symptoms in the next few weeks I have asked that she schedule an office visit and we can switch her over to [facility] or if she prefers a lower dose of Concerta have her  come in office and we can start that  Depression sounds fairly stable with Lexapro 10.  We discussed that the Wellbutrin may boost the antidepressant effect and also improve libido  Empiric treatment with Augmentin for suspected bacterial sinusitis  I have renewed her medicine for acne vulgaris but encouraged her not to utilize this medication whilst taking the Augmentin as it decreases efficacy  Start time: 12:52pm End time: 1:02pm  Total time spent on patient care (including video visit/ documentation): 10 minutes  Natalie Rhoads Hulen Skains, DO [facility] [phone]

## 2021-06-25 ENCOUNTER — Telehealth: Payer: Self-pay | Admitting: Family Medicine

## 2021-06-25 ENCOUNTER — Other Ambulatory Visit: Payer: Self-pay | Admitting: Family Medicine

## 2021-06-25 DIAGNOSIS — F321 Major depressive disorder, single episode, moderate: Secondary | ICD-10-CM

## 2021-06-25 DIAGNOSIS — F902 Attention-deficit hyperactivity disorder, combined type: Secondary | ICD-10-CM

## 2021-06-25 MED ORDER — BUPROPION HCL ER (SR) 150 MG PO TB12: 150.0000 mg | ORAL_TABLET | Freq: Every morning | ORAL | 3 refills | Status: DC

## 2021-06-25 NOTE — Telephone Encounter (Signed)
  Prescription Request  06/25/2021  Is this a "Controlled Substance" medicine? no  Have you seen your PCP in the last 2 weeks? Seen in September  If YES, route message to pool  -  If NO, patient needs to be scheduled for appointment.  What is the name of the medication or equipment? welbutrin  Have you contacted your pharmacy to request a refill?    Which pharmacy would you like this sent to? Pt is in [facility] and [facility]--[phone]   Patient notified that their request is being sent to the clinical staff for review and that they should receive a response within 2 business days.

## 2021-07-07 ENCOUNTER — Telehealth: Payer: Self-pay | Admitting: Family Medicine

## 2021-07-07 DIAGNOSIS — F321 Major depressive disorder, single episode, moderate: Secondary | ICD-10-CM

## 2021-07-07 DIAGNOSIS — F902 Attention-deficit hyperactivity disorder, combined type: Secondary | ICD-10-CM

## 2021-07-07 MED ORDER — BUPROPION HCL ER (SR) 150 MG PO TB12: 150.0000 mg | ORAL_TABLET | Freq: Every morning | ORAL | 0 refills | Status: DC

## 2021-07-07 MED ORDER — ESCITALOPRAM OXALATE 10 MG PO TABS
10.0000 mg | ORAL_TABLET | Freq: Every day | ORAL | 0 refills | Status: DC
Start: 2021-07-07 — End: 2022-04-22

## 2021-07-07 NOTE — Telephone Encounter (Signed)
  Prescription Request  07/07/2021  Is this a "Controlled Substance" medicine? no  Have you seen your PCP in the last 2 weeks? no  If YES, route message to pool  -  If NO, patient needs to be scheduled for appointment.  What is the name of the medication or equipment? buPROPion (WELLBUTRIN SR) 150 MG 12 hr tablet escitalopram (LEXAPRO) 10 MG tablet  Have you contacted your pharmacy to request a refill? Patient is in [place] for work and needs sent to different pharmacy    Which pharmacy would you like this sent to? [facility] [address], [place], [place] [address]   Patient notified that their request is being sent to the clinical staff for review and that they should receive a response within 2 business days.

## 2021-07-07 NOTE — Telephone Encounter (Signed)
Pt aware medication sent to pharmacy in [place]

## 2022-02-16 ENCOUNTER — Telehealth: Payer: Self-pay | Admitting: Family Medicine

## 2022-02-16 NOTE — Telephone Encounter (Signed)
Pt given travel medication info in grbo

## 2022-02-18 ENCOUNTER — Ambulatory Visit: Payer: Self-pay

## 2022-04-22 ENCOUNTER — Encounter: Payer: Self-pay | Admitting: Nurse Practitioner

## 2022-04-22 ENCOUNTER — Ambulatory Visit (INDEPENDENT_AMBULATORY_CARE_PROVIDER_SITE_OTHER): Payer: BC Managed Care – PPO | Admitting: Nurse Practitioner

## 2022-04-22 VITALS — BP 117/77 | HR 70 | Temp 97.8°F | Resp 20 | Ht 65.0 in | Wt 141.0 lb

## 2022-04-22 DIAGNOSIS — F321 Major depressive disorder, single episode, moderate: Secondary | ICD-10-CM | POA: Diagnosis not present

## 2022-04-22 DIAGNOSIS — F902 Attention-deficit hyperactivity disorder, combined type: Secondary | ICD-10-CM

## 2022-04-22 DIAGNOSIS — Z23 Encounter for immunization: Secondary | ICD-10-CM | POA: Diagnosis not present

## 2022-04-22 DIAGNOSIS — Z0001 Encounter for general adult medical examination with abnormal findings: Secondary | ICD-10-CM

## 2022-04-22 DIAGNOSIS — J45990 Exercise induced bronchospasm: Secondary | ICD-10-CM | POA: Diagnosis not present

## 2022-04-22 DIAGNOSIS — Z0000 Encounter for general adult medical examination without abnormal findings: Secondary | ICD-10-CM

## 2022-04-22 LAB — URINALYSIS
Bilirubin, UA: NEGATIVE
Glucose, UA: NEGATIVE
Leukocytes,UA: NEGATIVE
Nitrite, UA: NEGATIVE
Protein,UA: NEGATIVE
Specific Gravity, UA: 1.015 (ref 1.005–1.030)
Urobilinogen, Ur: 0.2 mg/dL (ref 0.2–1.0)
pH, UA: 7.5 (ref 5.0–7.5)

## 2022-04-22 MED ORDER — BUPROPION HCL ER (SR) 150 MG PO TB12: 150.0000 mg | ORAL_TABLET | Freq: Every morning | ORAL | 1 refills | Status: DC

## 2022-04-22 MED ORDER — ALBUTEROL SULFATE HFA 108 (90 BASE) MCG/ACT IN AERS: INHALATION_SPRAY | RESPIRATORY_TRACT | 2 refills | Status: AC

## 2022-04-22 MED ORDER — ESCITALOPRAM OXALATE 10 MG PO TABS: 10.0000 mg | ORAL_TABLET | Freq: Every day | ORAL | 1 refills | Status: DC

## 2022-04-22 NOTE — Patient Instructions (Addendum)
Exercising to Stay Healthy To become healthy and stay healthy, it is recommended that you do moderate-intensity and vigorous-intensity exercise. You can tell that you are exercising at a moderate intensity if your heart starts beating faster and you start breathing faster but can still hold a conversation. You can tell that you are exercising at a vigorous intensity if you are breathing much harder and faster and cannot hold a conversation while exercising. How can exercise benefit me? Exercising regularly is important. It has many health benefits, such as: Improving overall fitness, flexibility, and endurance. Increasing bone density. Helping with weight control. Decreasing body fat. Increasing muscle strength and endurance. Reducing stress and tension, anxiety, depression, or anger. Improving overall health. What guidelines should I follow while exercising? Before you start a new exercise program, talk with your health care provider. Do not exercise so much that you hurt yourself, feel dizzy, or get very short of breath. Wear comfortable clothes and wear shoes with good support. Drink plenty of water while you exercise to prevent dehydration or heat stroke. Work out until your breathing and your heartbeat get faster (moderate intensity). How often should I exercise? Choose an activity that you enjoy, and set realistic goals. Your health care provider can help you make an activity plan that is individually designed and works best for you. Exercise regularly as told by your health care provider. This may include: Doing strength training two times a week, such as: Lifting weights. Using resistance bands. Push-ups. Sit-ups. Yoga. Doing a certain intensity of exercise for a given amount of time. Choose from these options: A total of 150 minutes of moderate-intensity exercise every week. A total of 75 minutes of vigorous-intensity exercise every week. A mix of moderate-intensity and  vigorous-intensity exercise every week. Children, pregnant women, people who have not exercised regularly, people who are overweight, and older adults may need to talk with a health care provider about what activities are safe to perform. If you have a medical condition, be sure to talk with your health care provider before you start a new exercise program. What are some exercise ideas? Moderate-intensity exercise ideas include: Walking 1 mile (1.6 km) in about 15 minutes. Biking. Hiking. Golfing. Dancing. Water aerobics. Vigorous-intensity exercise ideas include: Walking 4.5 miles (7.2 km) or more in about 1 hour. Jogging or running 5 miles (8 km) in about 1 hour. Biking 10 miles (16.1 km) or more in about 1 hour. Lap swimming. Roller-skating or in-line skating. Cross-country skiing. Vigorous competitive sports, such as football, basketball, and soccer. Jumping rope. Aerobic dancing. What are some everyday activities that can help me get exercise? Yard work, such as: Pushing a lawn mower. Raking and bagging leaves. Washing your car. Pushing a stroller. Shoveling snow. Gardening. Washing windows or floors. How can I be more active in my day-to-day activities? Use stairs instead of an elevator. Take a walk during your lunch break. If you drive, park your car farther away from your work or school. If you take public transportation, get off one stop early and walk the rest of the way. Stand up or walk around during all of your indoor phone calls. Get up, stretch, and walk around every 30 minutes throughout the day. Enjoy exercise with a friend. Support to continue exercising will help you keep a regular routine of activity. Where to find more information You can find more information about exercising to stay healthy from: [place] Department of Health and Human Services: www.hhs.gov Centers for Disease Control and Prevention (  CDC): [employer] Summary Exercising regularly is  important. It will improve your overall fitness, flexibility, and endurance. Regular exercise will also improve your overall health. It can help you control your weight, reduce stress, and improve your bone density. Do not exercise so much that you hurt yourself, feel dizzy, or get very short of breath. Before you start a new exercise program, talk with your health care provider. This information is not intended to replace advice given to you by your health care provider. Make sure you discuss any questions you have with your health care provider. Document Revised: 11/20/2020 Document Reviewed: 11/20/2020 Elsevier Patient Education  2023 Else[employer] Inc[employer]  Health Maintenance, Female Adopting a healthy lifestyle and getting preventive care are important in promoting health and wellness. Ask your health care provider about: The right schedule for you to have regular tests and exams. Things you can do on your own to prevent diseases and keep yourself healthy. What should I know about diet, weight, and exercise? Eat a healthy diet  Eat a diet that includes plenty of vegetables, fruits, low-fat dairy products, and lean protein. Do not eat a lot of foods that are high in solid fats, added sugars, or sodium. Maintain a healthy weight Body mass index (BMI) is used to identify weight problems. It estimates body fat based on height and weight. Your health care provider can help determine your BMI and help you achieve or maintain a healthy weight. Get regular exercise Get regular exercise. This is one of the most important things you can do for your health. Most adults should: Exercise for at least 150 minutes each week. The exercise should increase your heart rate and make you sweat (moderate-intensity exercise). Do strengthening exercises at least twice a week. This is in addition to the moderate-intensity exercise. Spend less time sitting. Even light physical activity can be beneficial. Watch  cholesterol and blood lipids Have your blood tested for lipids and cholesterol at 26 years of age, then have this test every 5 years. Have your cholesterol levels checked more often if: Your lipid or cholesterol levels are high. You are older than 26 years of age. You are at high risk for heart disease. What should I know about cancer screening? Depending on your health history and family history, you may need to have cancer screening at various ages. This may include screening for: Breast cancer. Cervical cancer. Colorectal cancer. Skin cancer. Lung cancer. What should I know about heart disease, diabetes, and high blood pressure? Blood pressure and heart disease High blood pressure causes heart disease and increases the risk of stroke. This is more likely to develop in people who have high blood pressure readings or are overweight. Have your blood pressure checked: Every 3-5 years if you are 30-6 years of age. Every year if you are 46 years old or older. Diabetes Have regular diabetes screenings. This checks your fasting blood sugar level. Have the screening done: Once every three years after age 68 if you are at a normal weight and have a low risk for diabetes. More often and at a younger age if you are overweight or have a high risk for diabetes. What should I know about preventing infection? Hepatitis B If you have a higher risk for hepatitis B, you should be screened for this virus. Talk with your health care provider to find out if you are at risk for hepatitis B infection. Hepatitis C Testing is recommended for: Everyone born from [birth year] through 1965. Anyone with known  risk factors for hepatitis C. Sexually transmitted infections (STIs) Get screened for STIs, including gonorrhea and chlamydia, if: You are sexually active and are younger than 26 years of age. You are older than 26 years of age and your health care provider tells you that you are at risk for this type of  infection. Your sexual activity has changed since you were last screened, and you are at increased risk for chlamydia or gonorrhea. Ask your health care provider if you are at risk. Ask your health care provider about whether you are at high risk for HIV. Your health care provider may recommend a prescription medicine to help prevent HIV infection. If you choose to take medicine to prevent HIV, you should first get tested for HIV. You should then be tested every 3 months for as long as you are taking the medicine. Pregnancy If you are about to stop having your period (premenopausal) and you may become pregnant, seek counseling before you get pregnant. Take 400 to 800 micrograms (mcg) of folic acid every day if you become pregnant. Ask for birth control (contraception) if you want to prevent pregnancy. Osteoporosis and menopause Osteoporosis is a disease in which the bones lose minerals and strength with aging. This can result in bone fractures. If you are 65 years old or older, or if you are at risk for osteoporosis and fractures, ask your health care provider if you should: Be screened for bone loss. Take a calcium or vitamin D supplement to lower your risk of fractures. Be given hormone replacement therapy (HRT) to treat symptoms of menopause. Follow these instructions at home: Alcohol use Do not drink alcohol if: Your health care provider tells you not to drink. You are pregnant, may be pregnant, or are planning to become pregnant. If you drink alcohol: Limit how much you have to: 0-1 drink a day. Know how much alcohol is in your drink. In the [place] one drink equals one 12 oz bottle of beer (355 mL), one 5 oz glass of wine (148 mL), or one 1 oz glass of hard liquor (44 mL). Lifestyle Do not use any products that contain nicotine or tobacco. These products include cigarettes, chewing tobacco, and vaping devices, such as e-cigarettes. If you need help quitting, ask your health care  provider. Do not use street drugs. Do not share needles. Ask your health care provider for help if you need support or information about quitting drugs. General instructions Schedule regular health, dental, and eye exams. Stay current with your vaccines. Tell your health care provider if: You often feel depressed. You have ever been abused or do not feel safe at home. Summary Adopting a healthy lifestyle and getting preventive care are important in promoting health and wellness. Follow your health care provider's instructions about healthy diet, exercising, and getting tested or screened for diseases. Follow your health care provider's instructions on monitoring your cholesterol and blood pressure. This information is not intended to replace advice given to you by your health care provider. Make sure you discuss any questions you have with your health care provider. Document Revised: 12/14/2020 Document Reviewed: 12/14/2020 Elsevier Patient Education  2023 Elsevier Inc.  

## 2022-04-22 NOTE — Progress Notes (Signed)
Subjective:    Patient ID: Natalie Hawkins, female    DOB: 1996/02/06, 26 y.o.   MRN: [id number]   Chief Complaint: Annual Exam    HPI:  Natalie Hawkins is a 26 y.o. who identifies as a female who was assigned female at birth.   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 Attention deficit hyperactivity disorder (ADHD), combined type She is on no adhd meds and she says sheis doing ok  2. Depression, major, single episode, moderate ([facility]) She is no longer on wellbutrin or lexapro    11/12/2019    9:27 AM 10/07/2019    8:46 AM 08/09/2018   10:30 AM  Depression screen PHQ 2/9  Decreased Interest 0 2 0  Down, Depressed, Hopeless 0 3 0  PHQ - 2 Score 0 5 0  Altered sleeping 1 3   Tired, decreased energy 0 3   Change in appetite 0 3   Feeling bad or failure about yourself  0 2   Trouble concentrating 0 2   Moving slowly or fidgety/restless 0 2   Suicidal thoughts 0 0   PHQ-9 Score 1 20   Difficult doing work/chores Not difficult at all Extremely dIfficult       11/12/2019    9:27 AM 10/07/2019    8:48 AM  GAD 7 : Generalized Anxiety Score  Nervous, Anxious, on Edge 0 3  Control/stop worrying 0 3  Worry too much - different things 0 3  Trouble relaxing 0 2  Restless 0 2  Easily annoyed or irritable 0 3  Afraid - awful might happen 0 3  Total GAD 7 Score 0 19  Anxiety Difficulty Not difficult at all Extremely difficult       New complaints: None today  No Known Allergies Outpatient Encounter Medications as of 04/22/2022  Medication Sig   albuterol (PROAIR HFA) 108 (90 Base) MCG/ACT inhaler USE 2 PUFFS EVERY 6 HOURS AS NEEDED   etonogestrel-ethinyl estradiol (NUVARING) 0.12-0.015 MG/24HR vaginal ring Place vaginally.   buPROPion (WELLBUTRIN SR) 150 MG 12 hr tablet Take 1 tablet (150 mg total) by mouth in the morning.   escitalopram (LEXAPRO) 10 MG  tablet Take 1 tablet (10 mg total) by mouth daily.   [DISCONTINUED] amoxicillin-clavulanate (AUGMENTIN) 875-125 MG tablet Take 1 tablet by mouth 2 (two) times daily. (Patient not taking: Reported on 04/22/2022)   [DISCONTINUED] buPROPion (WELLBUTRIN SR) 150 MG 12 hr tablet Take 1 tablet (150 mg total) by mouth in the morning. (Patient not taking: Reported on 04/22/2022)   [DISCONTINUED] escitalopram (LEXAPRO) 10 MG tablet Take 1 tablet (10 mg total) by mouth daily. (Patient not taking: Reported on 04/22/2022)   [DISCONTINUED] minocycline (MINOCIN) 100 MG capsule Take 1 capsule (100 mg total) by mouth 2 (two) times daily. (Patient not taking: Reported on 04/22/2022)   No facility-administered encounter medications on file as of 04/22/2022.    History reviewed. No pertinent surgical history.  Family History  Problem Relation Age of Onset   Hypothyroidism Father    ADD / ADHD Sister    ADD / ADHD Brother    Arthritis Maternal Grandmother    Depression Maternal Grandmother    Heart disease Maternal Grandmother       Controlled substance contract: n/a    Review of Systems  Constitutional:  Negative for diaphoresis.  Eyes:  Negative  for pain.  Respiratory:  Negative for shortness of breath.   Cardiovascular:  Negative for chest pain, palpitations and leg swelling.  Gastrointestinal:  Negative for abdominal pain.  Endocrine: Negative for polydipsia.  Skin:  Negative for rash.  Neurological:  Negative for dizziness, weakness and headaches.  Hematological:  Does not bruise/bleed easily.  All other systems reviewed and are negative.      Objective:   Physical Exam Vitals and nursing note reviewed.  Constitutional:      General: She is not in acute distress.    Appearance: Normal appearance. She is well-developed.  HENT:     Head: Normocephalic.     Right Ear: Tympanic membrane normal.     Left Ear: Tympanic membrane normal.     Nose: Nose normal.     Mouth/Throat:     Mouth:  Mucous membranes are moist.  Eyes:     Pupils: Pupils are equal, round, and reactive to light.  Neck:     Vascular: No carotid bruit or JVD.  Cardiovascular:     Rate and Rhythm: Normal rate and regular rhythm.     Heart sounds: Normal heart sounds.  Pulmonary:     Effort: Pulmonary effort is normal. No respiratory distress.     Breath sounds: Normal breath sounds. No wheezing or rales.  Chest:     Chest wall: No tenderness.  Abdominal:     General: Bowel sounds are normal. There is no distension or abdominal bruit.     Palpations: Abdomen is soft. There is no hepatomegaly, splenomegaly, mass or pulsatile mass.     Tenderness: There is no abdominal tenderness.  Musculoskeletal:        General: Normal range of motion.     Cervical back: Normal range of motion and neck supple.  Lymphadenopathy:     Cervical: No cervical adenopathy.  Skin:    General: Skin is warm and dry.  Neurological:     Mental Status: She is alert and oriented to person, place, and time.     Deep Tendon Reflexes: Reflexes are normal and symmetric.  Psychiatric:        Behavior: Behavior normal.        Thought Content: Thought content normal.        Judgment: Judgment normal.    BP 117/77   Pulse 70   Temp 97.8 F (36.6 C) (Temporal)   Resp 20   Ht $R'5\' 5"'it$  (1.651 m)   Wt 141 lb (64 kg)   BMI 23.46 kg/m         Assessment & Plan:   [name] comes in today with chief complaint of Annual Exam   Diagnosis and orders addressed:  1. Attention deficit hyperactivity disorder (ADHD), combined type  2. Depression, major, single episode, moderate ([place]) Stress management  3. Annual physical exam Abs pending - CBC with Differential/Platelet - CMP14+EGFR - HepB+HepC+HIV Panel - Lipid panel - Urinalysis  4. Exercise-induced asthma - albuterol (PROAIR HFA) 108 (90 Base) MCG/ACT inhaler; USE 2 PUFFS EVERY 6 HOURS AS NEEDED  Dispense: 8.5 g; Refill: 2   Labs pending Health Maintenance  reviewed Diet and exercise encouraged  Follow up plan: prn   Mary-Margaret Hassell Done, FNP

## 2022-04-22 NOTE — Addendum Note (Signed)
Addended by: Cleda Daub on: 04/22/2022 02:00 PM   Modules accepted: Orders

## 2022-04-25 NOTE — Telephone Encounter (Signed)
Ou are not at risk for stroke or heart attack and you have no other heath issues so just continue to watch fats in diet. May be famial and only thing that will help is meds- but you do not need meds at this time.

## 2022-04-26 LAB — CBC WITH DIFFERENTIAL/PLATELET
Basophils Absolute: 0 10*3/uL (ref 0.0–0.2)
Basos: 0 %
EOS (ABSOLUTE): 0.1 10*3/uL (ref 0.0–0.4)
Eos: 1 %
Hematocrit: 40 % (ref 34.0–46.6)
Hemoglobin: 13 g/dL (ref 11.1–15.9)
Immature Grans (Abs): 0 10*3/uL (ref 0.0–0.1)
Immature Granulocytes: 0 %
Lymphocytes Absolute: 1.4 10*3/uL (ref 0.7–3.1)
Lymphs: 25 %
MCH: 26.4 pg — ABNORMAL LOW (ref 26.6–33.0)
MCHC: 32.5 g/dL (ref 31.5–35.7)
MCV: 81 fL (ref 79–97)
Monocytes Absolute: 0.4 10*3/uL (ref 0.1–0.9)
Monocytes: 7 %
Neutrophils Absolute: 3.7 10*3/uL (ref 1.4–7.0)
Neutrophils: 67 %
Platelets: 297 10*3/uL (ref 150–450)
RBC: 4.93 x10E6/uL (ref 3.77–5.28)
RDW: 12.4 % (ref 11.7–15.4)
WBC: 5.6 10*3/uL (ref 3.4–10.8)

## 2022-04-26 LAB — CMP14+EGFR
ALT: 14 IU/L (ref 0–32)
AST: 23 IU/L (ref 0–40)
Albumin/Globulin Ratio: 1.5 (ref 1.2–2.2)
Albumin: 4.3 g/dL (ref 4.0–5.0)
Alkaline Phosphatase: 45 IU/L (ref 44–121)
BUN/Creatinine Ratio: 10 (ref 9–23)
BUN: 10 mg/dL (ref 6–20)
Bilirubin Total: 0.5 mg/dL (ref 0.0–1.2)
CO2: 21 mmol/L (ref 20–29)
Calcium: 9.4 mg/dL (ref 8.7–10.2)
Chloride: 104 mmol/L (ref 96–106)
Creatinine, Ser: 0.99 mg/dL (ref 0.57–1.00)
Globulin, Total: 2.8 g/dL (ref 1.5–4.5)
Glucose: 78 mg/dL (ref 70–99)
Potassium: 4.2 mmol/L (ref 3.5–5.2)
Sodium: 139 mmol/L (ref 134–144)
Total Protein: 7.1 g/dL (ref 6.0–8.5)
eGFR: 81 mL/min/{1.73_m2} (ref >59)

## 2022-04-26 LAB — HEPB+HEPC+HIV PANEL
HIV Screen 4th Generation wRfx: NONREACTIVE
Hep B C IgM: NEGATIVE
Hep B Core Total Ab: NEGATIVE
Hep B E Ab: NEGATIVE
Hep B E Ag: NEGATIVE
Hep B Surface Ab, Qual: NONREACTIVE
Hep C Virus Ab: NONREACTIVE
Hepatitis B Surface Ag: NEGATIVE

## 2022-04-26 LAB — LIPID PANEL
Chol/HDL Ratio: 2.7 ratio (ref 0.0–4.4)
Cholesterol, Total: 229 mg/dL — ABNORMAL HIGH (ref 100–199)
HDL: 84 mg/dL (ref >39)
LDL Chol Calc (NIH): 130 mg/dL — ABNORMAL HIGH (ref 0–99)
Triglycerides: 88 mg/dL (ref 0–149)
VLDL Cholesterol Cal: 15 mg/dL (ref 5–40)

## 2022-04-28 ENCOUNTER — Other Ambulatory Visit: Payer: Self-pay

## 2022-04-28 DIAGNOSIS — Z0000 Encounter for general adult medical examination without abnormal findings: Secondary | ICD-10-CM

## 2022-04-29 ENCOUNTER — Other Ambulatory Visit: Payer: BC Managed Care – PPO

## 2022-04-29 DIAGNOSIS — Z0000 Encounter for general adult medical examination without abnormal findings: Secondary | ICD-10-CM

## 2022-04-30 LAB — HEPATITIS B SURFACE ANTIBODY, QUANTITATIVE: Hepatitis B Surf Ab Quant: 10.8 m[IU]/mL (ref >9.9)

## 2023-02-08 ENCOUNTER — Ambulatory Visit (INDEPENDENT_AMBULATORY_CARE_PROVIDER_SITE_OTHER): Payer: Self-pay | Admitting: Family Medicine

## 2023-02-08 ENCOUNTER — Encounter: Payer: Self-pay | Admitting: Family Medicine

## 2023-02-08 VITALS — BP 111/66 | HR 58 | Temp 98.0°F | Ht 64.5 in | Wt 142.8 lb

## 2023-02-08 DIAGNOSIS — Z8619 Personal history of other infectious and parasitic diseases: Secondary | ICD-10-CM

## 2023-02-08 DIAGNOSIS — L02214 Cutaneous abscess of groin: Secondary | ICD-10-CM

## 2023-02-08 MED ORDER — SULFAMETHOXAZOLE-TRIMETHOPRIM 800-160 MG PO TABS: 1.0000 | ORAL_TABLET | Freq: Two times a day (BID) | ORAL | 0 refills | Status: AC

## 2023-02-08 MED ORDER — FLUCONAZOLE 150 MG PO TABS: 150.0000 mg | ORAL_TABLET | Freq: Once | ORAL | 0 refills | Status: AC

## 2023-02-08 NOTE — Progress Notes (Signed)
Subjective:  Patient ID: Natalie Hawkins, female    DOB: 05/27/1996, 27 y.o.   MRN: [id number]  Patient Care Team: Raliegh Ip, DO as PCP - General (Family Medicine)   Chief Complaint:  Natalie Hawkins   HPI: Natalie Hawkins is a 27 y.o. female presenting on 02/08/2023 for INGROWN HAIR   Pt presents today for evaluation of swelling and pain to left groin area. States this started after shaving. The area got really swollen, started draining, and then started swelling up again. No longer draining, tender, and erythematous.       Relevant past medical, surgical, family, and social history reviewed and updated as indicated.  Allergies and medications reviewed and updated. Data reviewed: Chart in Epic.   Past Medical History:  Diagnosis Date   Allergy    Asthma    Well-controlled at this time   Depression     History reviewed. No pertinent surgical history.  Social History   Socioeconomic History   Marital status: Single    Spouse name: Not on file   Number of children: Not on file   Years of education: Not on file   Highest education level: Not on file  Occupational History   Not on file  Tobacco Use   Smoking status: Never   Smokeless tobacco: Never  Vaping Use   Vaping Use: Never used  Substance and Sexual Activity   Alcohol use: No    Comment: occ   Drug use: No   Sexual activity: Not Currently  Other Topics Concern   Not on file  Social History Narrative   Not on file   Social Determinants of Health   Financial Resource Strain: Not on file  Food Insecurity: Not on file  Transportation Needs: Not on file  Physical Activity: Not on file  Stress: Not on file  Social Connections: Not on file  Intimate Partner Violence: Not on file    Outpatient Encounter Medications as of 02/08/2023  Medication Sig   albuterol (PROAIR HFA) 108 (90 Base) MCG/ACT inhaler USE 2 PUFFS EVERY 6 HOURS AS NEEDED   fluconazole (DIFLUCAN) 150 MG tablet Take 1 tablet  (150 mg total) by mouth once for 1 dose.   sulfamethoxazole-trimethoprim (BACTRIM DS) 800-160 MG tablet Take 1 tablet by mouth 2 (two) times daily for 10 days.   [DISCONTINUED] etonogestrel-ethinyl estradiol (NUVARING) 0.12-0.015 MG/24HR vaginal ring Place vaginally.   No facility-administered encounter medications on file as of 02/08/2023.    No Known Allergies  Review of Systems  Constitutional:  Negative for activity change, appetite change, chills, diaphoresis, fatigue, fever and unexpected weight change.  HENT: Negative.    Eyes: Negative.  Negative for photophobia and visual disturbance.  Respiratory:  Negative for cough, chest tightness and shortness of breath.   Cardiovascular:  Negative for chest pain, palpitations and leg swelling.  Gastrointestinal:  Negative for blood in stool, constipation, diarrhea, nausea and vomiting.  Endocrine: Negative.   Genitourinary:  Negative for difficulty urinating, dysuria, frequency and urgency.  Musculoskeletal:  Negative for arthralgias and myalgias.  Skin:  Positive for color change and wound. Negative for pallor and rash.  Allergic/Immunologic: Negative.   Neurological:  Negative for dizziness and headaches.  Hematological: Negative.   Psychiatric/Behavioral:  Negative for confusion, hallucinations, sleep disturbance and suicidal ideas.   All other systems reviewed and are negative.       Objective:  BP 111/66   Pulse (!) 58   Temp 98 F (36.7  C)   Ht 5' 4.5" (1.638 m)   Wt 142 lb 12.8 oz (64.8 kg)   SpO2 100%   BMI 24.13 kg/m    Wt Readings from Last 3 Encounters:  02/08/23 142 lb 12.8 oz (64.8 kg)  04/22/22 141 lb (64 kg)  11/12/19 137 lb (62.1 kg)    Physical Exam Vitals and nursing note reviewed.  Constitutional:      Appearance: Normal appearance. She is normal weight.  HENT:     Head: Normocephalic and atraumatic.     Mouth/Throat:     Mouth: Mucous membranes are moist.     Pharynx: Oropharynx is clear.  Eyes:      Conjunctiva/sclera: Conjunctivae normal.     Pupils: Pupils are equal, round, and reactive to light.  Cardiovascular:     Rate and Rhythm: Normal rate and regular rhythm.     Heart sounds: Normal heart sounds.  Pulmonary:     Effort: Pulmonary effort is normal.     Breath sounds: Normal breath sounds.  Musculoskeletal:     Cervical back: Normal range of motion and neck supple.  Skin:    General: Skin is warm and dry.     Capillary Refill: Capillary refill takes less than 2 seconds.     Findings: Abscess and erythema present.       Neurological:     General: No focal deficit present.     Mental Status: She is alert and oriented to person, place, and time.  Psychiatric:        Mood and Affect: Mood normal.        Behavior: Behavior normal.        Thought Content: Thought content normal.        Judgment: Judgment normal.     Results for orders placed or performed in visit on 04/29/22  Hepatitis B surface antibody,quantitative  Result Value Ref Range   Hepatitis B Surf Ab Quant 10.8 Immunity>9.9 mIU/mL     I & D  Date/Time: 02/08/2023 3:40 PM  Performed by: Sonny Masters, FNP Authorized by: Sonny Masters, FNP   Consent:    Consent obtained:  Verbal   Consent given by:  Patient   Risks, benefits, and alternatives were discussed: yes     Risks discussed:  Bleeding, incomplete drainage, pain, infection and damage to other organs   Alternatives discussed:  No treatment, delayed treatment, alternative treatment, observation and referral Universal protocol:    Procedure explained and questions answered to patient or proxy's satisfaction: yes     Relevant documents present and verified: yes     Immediately prior to procedure a time out was called: yes     Patient identity confirmed:  Verbally with patient Location:    Type:  Abscess   Size:  2x3   Location: left groin. Pre-procedure details:    Skin preparation:  Povidone-iodine and alcohol Sedation:    Sedation  type:  None Anesthesia:    Anesthesia method:  Topical application and local infiltration   Local anesthetic:  Lidocaine 2% WITH epi Procedure type:    Complexity:  Complex Procedure details:    Needle aspiration: no     Incision types:  Single straight   Scalpel blade:  11   Wound management:  Probed and deloculated, irrigated with saline and extensive cleaning   Drainage:  Purulent and bloody   Drainage amount:  Moderate   Wound treatment:  Wound left open   Packing materials:  None  Post-procedure details:    Procedure completion:  Tolerated well, no immediate complications    Pertinent labs & imaging results that were available during my care of the patient were reviewed by me and considered in my medical decision making.  Assessment & Plan:  Amirrah was seen today for ingrown hair.  Diagnoses and all orders for this visit:  Groin abscess I&D in office, tolerated well. Bactrim as prescribed. Symptomatic care discussed in detail. Aware to report new, worsening, or persistent symptoms. Prevention discussed in detail.  -     sulfamethoxazole-trimethoprim (BACTRIM DS) 800-160 MG tablet; Take 1 tablet by mouth 2 (two) times daily for 10 days. -     I & D  History of candidiasis Aware of when to take.  -     fluconazole (DIFLUCAN) 150 MG tablet; Take 1 tablet (150 mg total) by mouth once for 1 dose.     Continue all other maintenance medications.  Follow up plan: Return if symptoms worsen or fail to improve.   Continue healthy lifestyle choices, including diet (rich in fruits, vegetables, and lean proteins, and low in salt and simple carbohydrates) and exercise (at least 30 minutes of moderate physical activity daily).  Educational handout given for abscess I&D  The above assessment and management plan was discussed with the patient. The patient verbalized understanding of and has agreed to the management plan. Patient is aware to call the clinic if they develop any new  symptoms or if symptoms persist or worsen. Patient is aware when to return to the clinic for a follow-up visit. Patient educated on when it is appropriate to go to the emergency department.   Kari Baars, FNP-C [facility] [phone]
